# Patient Record
Sex: Male | Born: 1945
Health system: Southern US, Community
[De-identification: ages and names within clinical notes are randomized; demographics above are authoritative.]

## PROBLEM LIST (undated history)

## (undated) DIAGNOSIS — E041 Nontoxic single thyroid nodule: Secondary | ICD-10-CM

## (undated) DIAGNOSIS — E785 Hyperlipidemia, unspecified: Secondary | ICD-10-CM

## (undated) DIAGNOSIS — I10 Essential (primary) hypertension: Secondary | ICD-10-CM

## (undated) DIAGNOSIS — K219 Gastro-esophageal reflux disease without esophagitis: Secondary | ICD-10-CM

## (undated) HISTORY — DX: Hyperlipidemia, unspecified: E78.5

## (undated) HISTORY — DX: Essential (primary) hypertension: I10

## (undated) HISTORY — DX: Nontoxic single thyroid nodule: E04.1

## (undated) HISTORY — PX: CATARACT EXTRACTION, BILATERAL: SHX1313

## (undated) HISTORY — DX: Gastro-esophageal reflux disease without esophagitis: K21.9

## (undated) HISTORY — PX: THYROID SURGERY: SHX805

---

## 2002-10-14 LAB — HM COLONOSCOPY: HM Colonoscopy: NORMAL

## 2002-12-28 ENCOUNTER — Ambulatory Visit (HOSPITAL_COMMUNITY): Admission: RE | Admit: 2002-12-28 | Discharge: 2002-12-28 | Payer: Self-pay | Admitting: Gastroenterology

## 2002-12-28 ENCOUNTER — Encounter: Payer: Self-pay | Admitting: Internal Medicine

## 2004-07-23 ENCOUNTER — Encounter: Admission: RE | Admit: 2004-07-23 | Discharge: 2004-07-23 | Payer: Self-pay | Admitting: Internal Medicine

## 2004-08-17 ENCOUNTER — Ambulatory Visit: Payer: Self-pay | Admitting: Internal Medicine

## 2005-03-15 ENCOUNTER — Encounter: Payer: Self-pay | Admitting: Internal Medicine

## 2005-05-24 ENCOUNTER — Ambulatory Visit: Payer: Self-pay | Admitting: Internal Medicine

## 2005-06-18 ENCOUNTER — Ambulatory Visit: Payer: Self-pay | Admitting: Internal Medicine

## 2005-06-24 ENCOUNTER — Ambulatory Visit: Payer: Self-pay | Admitting: Internal Medicine

## 2005-07-01 ENCOUNTER — Ambulatory Visit: Payer: Self-pay | Admitting: Internal Medicine

## 2005-12-12 ENCOUNTER — Ambulatory Visit: Payer: Self-pay | Admitting: Internal Medicine

## 2005-12-23 ENCOUNTER — Ambulatory Visit: Payer: Self-pay | Admitting: Internal Medicine

## 2006-01-03 ENCOUNTER — Ambulatory Visit: Payer: Self-pay | Admitting: Internal Medicine

## 2006-01-10 ENCOUNTER — Ambulatory Visit: Payer: Self-pay | Admitting: Internal Medicine

## 2006-05-12 ENCOUNTER — Ambulatory Visit: Payer: Self-pay | Admitting: Internal Medicine

## 2006-06-09 ENCOUNTER — Ambulatory Visit: Payer: Self-pay | Admitting: Internal Medicine

## 2006-06-19 ENCOUNTER — Encounter: Payer: Self-pay | Admitting: Cardiology

## 2006-06-19 ENCOUNTER — Ambulatory Visit: Payer: Self-pay

## 2006-07-03 ENCOUNTER — Ambulatory Visit: Payer: Self-pay

## 2006-07-11 ENCOUNTER — Encounter: Admission: RE | Admit: 2006-07-11 | Discharge: 2006-07-11 | Payer: Self-pay | Admitting: Internal Medicine

## 2006-07-18 ENCOUNTER — Ambulatory Visit: Payer: Self-pay | Admitting: Internal Medicine

## 2006-07-24 ENCOUNTER — Other Ambulatory Visit: Admission: RE | Admit: 2006-07-24 | Discharge: 2006-07-24 | Payer: Self-pay | Admitting: Interventional Radiology

## 2006-07-24 ENCOUNTER — Encounter (INDEPENDENT_AMBULATORY_CARE_PROVIDER_SITE_OTHER): Payer: Self-pay | Admitting: Specialist

## 2006-07-24 ENCOUNTER — Encounter: Admission: RE | Admit: 2006-07-24 | Discharge: 2006-07-24 | Payer: Self-pay | Admitting: Internal Medicine

## 2006-11-03 ENCOUNTER — Ambulatory Visit: Payer: Self-pay | Admitting: Internal Medicine

## 2006-12-26 ENCOUNTER — Ambulatory Visit: Payer: Self-pay | Admitting: Internal Medicine

## 2006-12-26 LAB — CONVERTED CEMR LAB
ALT: 21 units/L (ref 0–40)
AST: 26 units/L (ref 0–37)
Albumin: 4 g/dL (ref 3.5–5.2)
Alkaline Phosphatase: 43 units/L (ref 39–117)
BUN: 9 mg/dL (ref 6–23)
Basophils Absolute: 0 10*3/uL (ref 0.0–0.1)
Basophils Relative: 0 % (ref 0.0–1.0)
Bilirubin, Direct: 0.1 mg/dL (ref 0.0–0.3)
CO2: 31 meq/L (ref 19–32)
Calcium: 9.6 mg/dL (ref 8.4–10.5)
Chloride: 104 meq/L (ref 96–112)
Cholesterol: 123 mg/dL (ref 0–200)
Creatinine, Ser: 1.1 mg/dL (ref 0.4–1.5)
Eosinophils Absolute: 0.6 10*3/uL (ref 0.0–0.6)
Eosinophils Relative: 12.8 % — ABNORMAL HIGH (ref 0.0–5.0)
GFR calc Af Amer: 88 mL/min
GFR calc non Af Amer: 73 mL/min
Glucose, Bld: 96 mg/dL (ref 70–99)
HCT: 39.1 % (ref 39.0–52.0)
HDL: 43.3 mg/dL (ref >39.0)
Hemoglobin: 13.8 g/dL (ref 13.0–17.0)
LDL Cholesterol: 69 mg/dL (ref 0–99)
Lymphocytes Relative: 22.6 % (ref 12.0–46.0)
MCHC: 35.4 g/dL (ref 30.0–36.0)
MCV: 93.1 fL (ref 78.0–100.0)
Monocytes Absolute: 0.3 10*3/uL (ref 0.2–0.7)
Monocytes Relative: 6.3 % (ref 3.0–11.0)
Neutro Abs: 2.8 10*3/uL (ref 1.4–7.7)
Neutrophils Relative %: 58.3 % (ref 43.0–77.0)
PSA: 0.68 ng/mL (ref 0.10–4.00)
Platelets: 184 10*3/uL (ref 150–400)
Potassium: 4.9 meq/L (ref 3.5–5.1)
RBC: 4.2 M/uL — ABNORMAL LOW (ref 4.22–5.81)
RDW: 13.1 % (ref 11.5–14.6)
Sodium: 141 meq/L (ref 135–145)
TSH: 0.4 microintl units/mL (ref 0.35–5.50)
Total Bilirubin: 0.7 mg/dL (ref 0.3–1.2)
Total CHOL/HDL Ratio: 2.8
Total Protein: 7.2 g/dL (ref 6.0–8.3)
Triglycerides: 56 mg/dL (ref 0–149)
VLDL: 11 mg/dL (ref 0–40)
WBC: 4.8 10*3/uL (ref 4.5–10.5)

## 2007-01-12 ENCOUNTER — Ambulatory Visit: Payer: Self-pay | Admitting: Internal Medicine

## 2007-04-19 IMAGING — US US SOFT TISSUE HEAD/NECK
1 series · 14 of 25 positions shown · non-contrast
Comparison: none

CLINICAL DATA: Thyroid nodules noted on carotid artery study. 
 THYROID ULTRASOUND:
TECHNIQUE: Ultrasound examination of the thyroid gland and adjacent soft tissue structures was performed.
 The thyroid gland is diffusely enlarged.   The right lobe measures 7.6 cm sagittally with a depth of 2.8 cm and width of 3.1 cm.  The left lobe measures 6.5 x 2.9 x 2.5 cm with the isthmus being thickened measuring 8 mm.  There are nodules present bilaterally.  The largest nodule is in the lower pole on the right measuring 1.9 x 1.1 x 1.4 cm with a second nodule in the periphery of the right mid lobe measuring 1.5 x 1.2 x 1.1 cm.  Two small subcentimeter nodules are noted on the left.

[Series 1: unknown · 0.13mm/px · 14 of 58 slices shown]
[im 1/58]
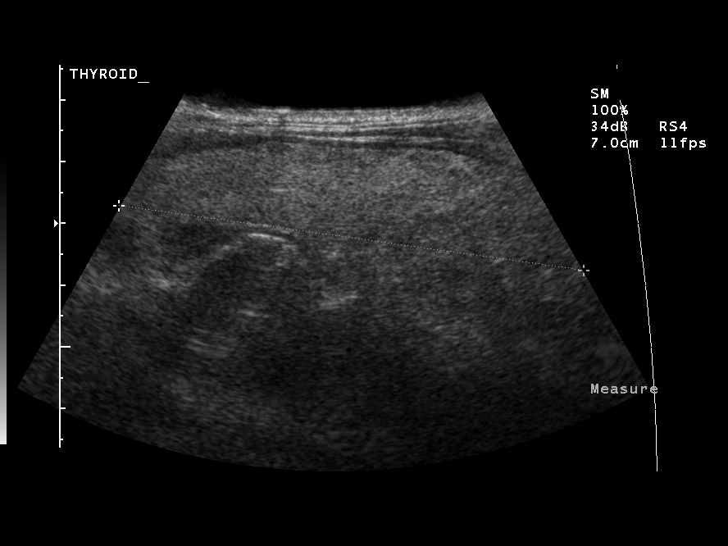
[im 5/58]
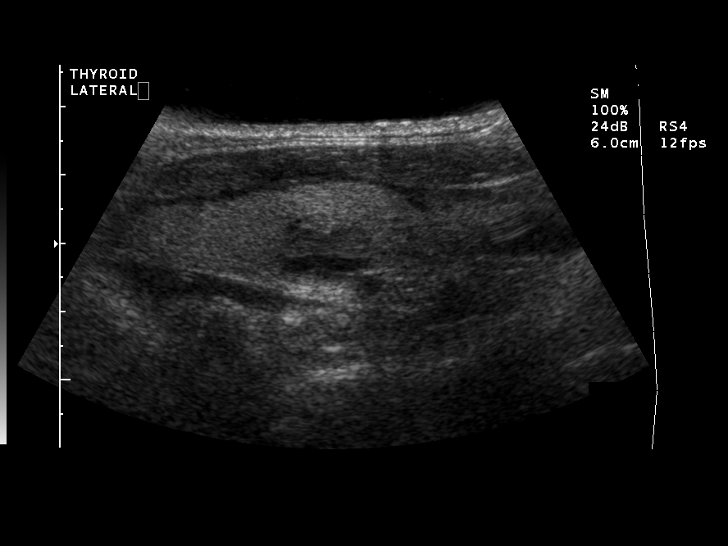
[im 10/58]
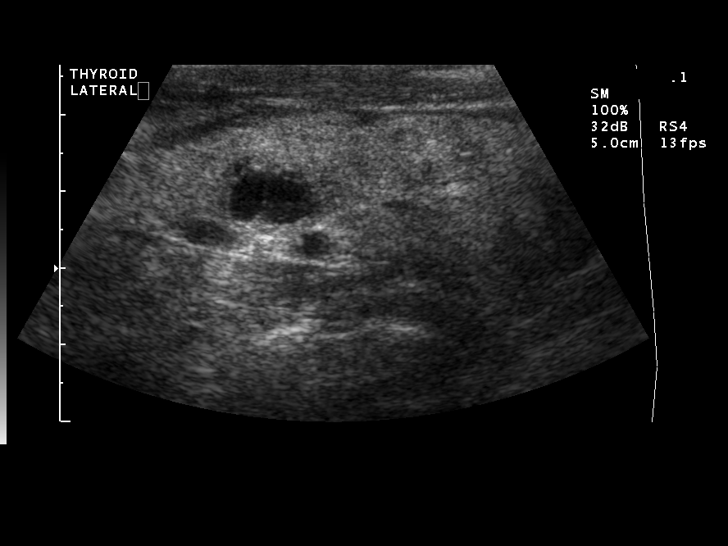
[im 15/58]
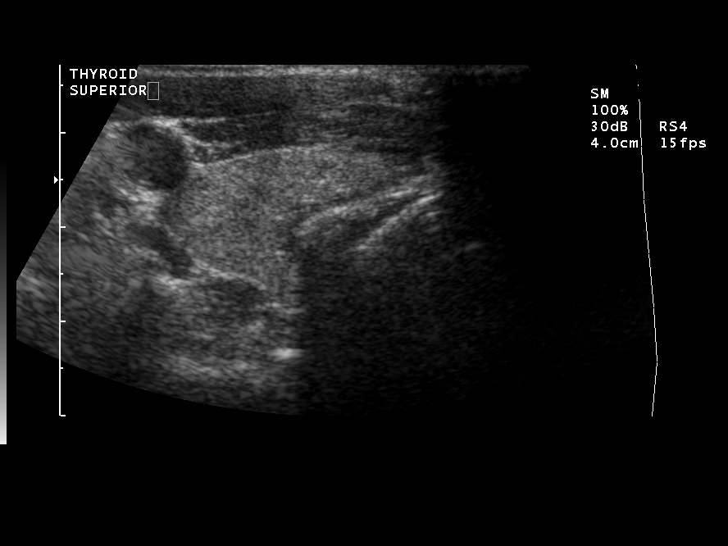
[im 20/58]
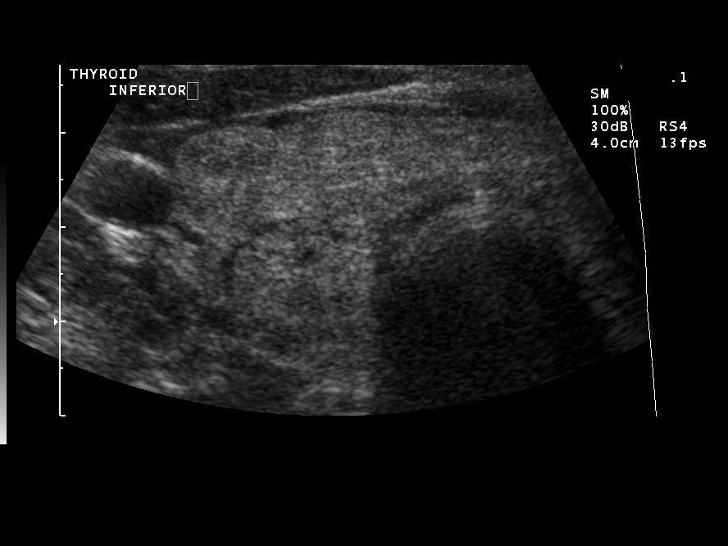
[im 22/58]
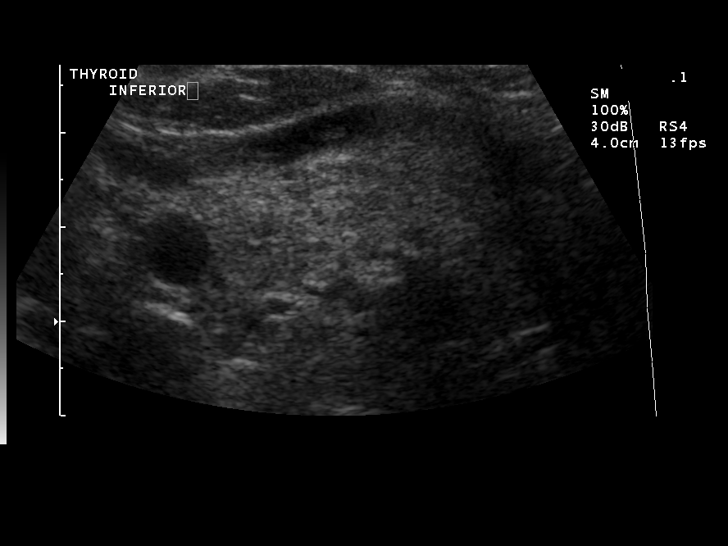
[im 27/58]
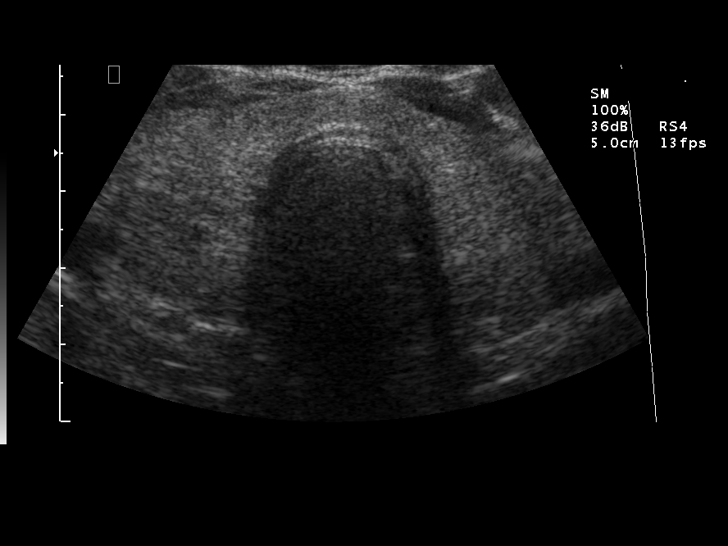
[im 31/58]
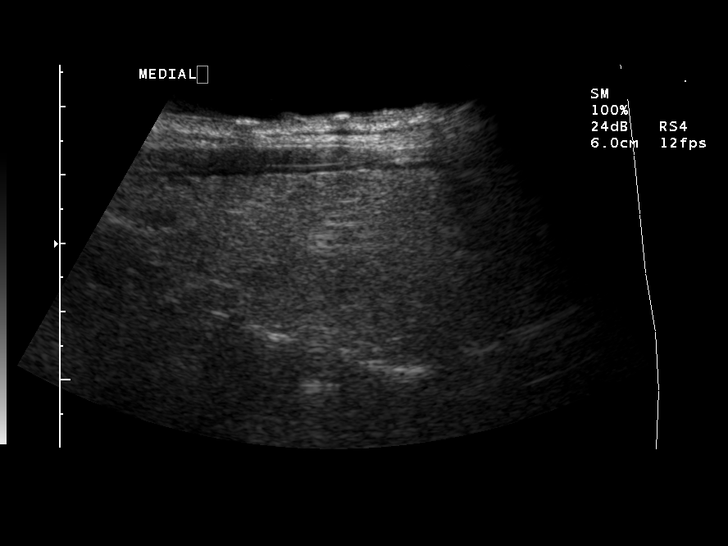
[im 36/58]
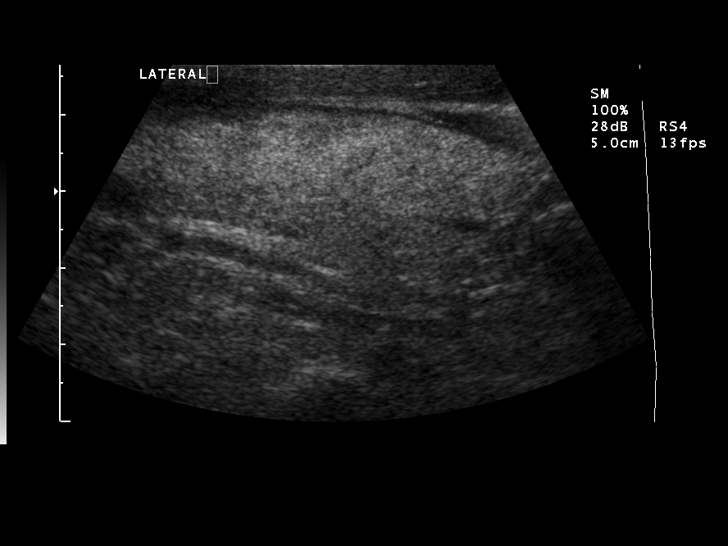
[im 39/58]
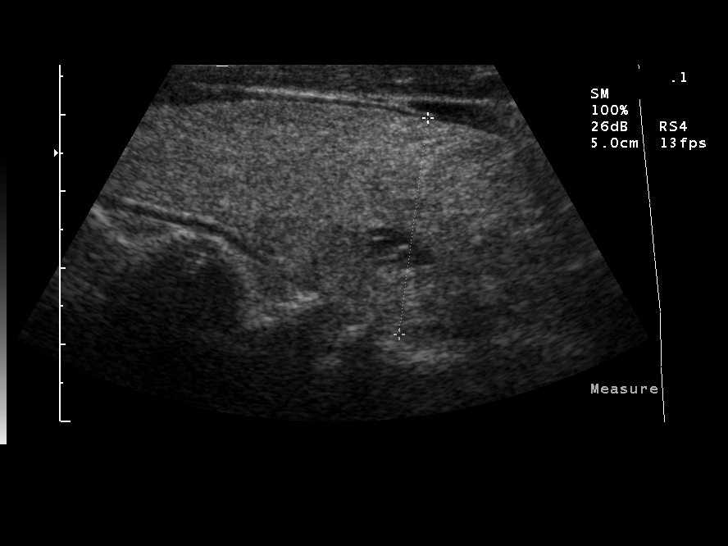
[im 43/58]
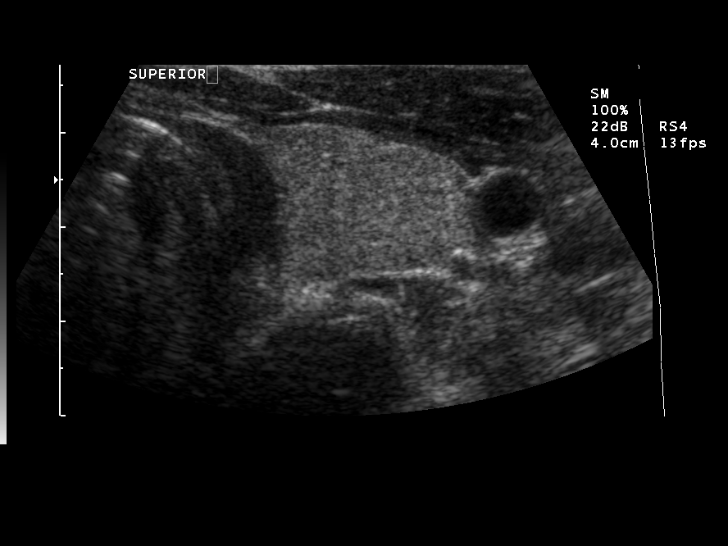
[im 48/58]
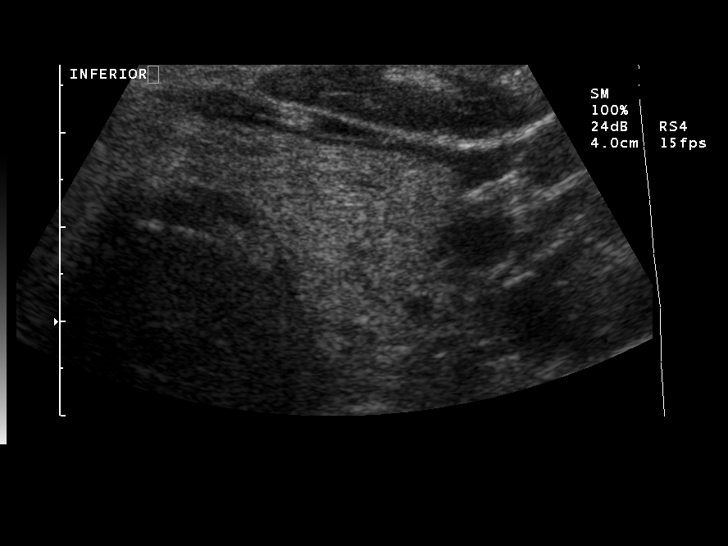
[im 53/58]
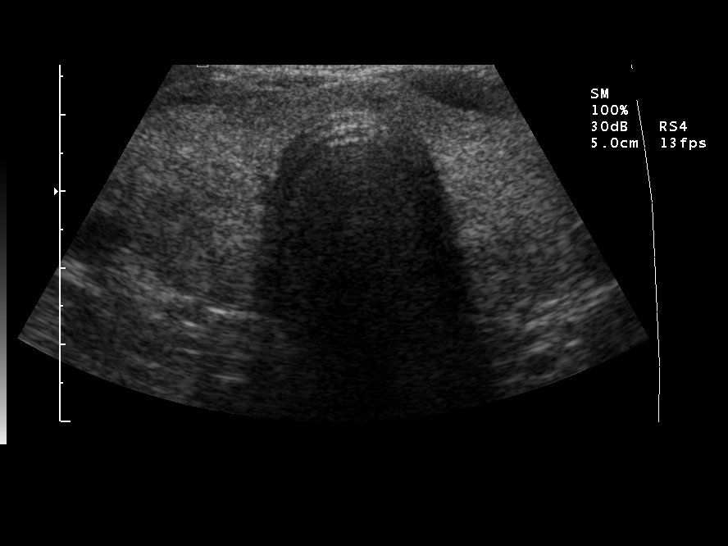
[im 58/58]
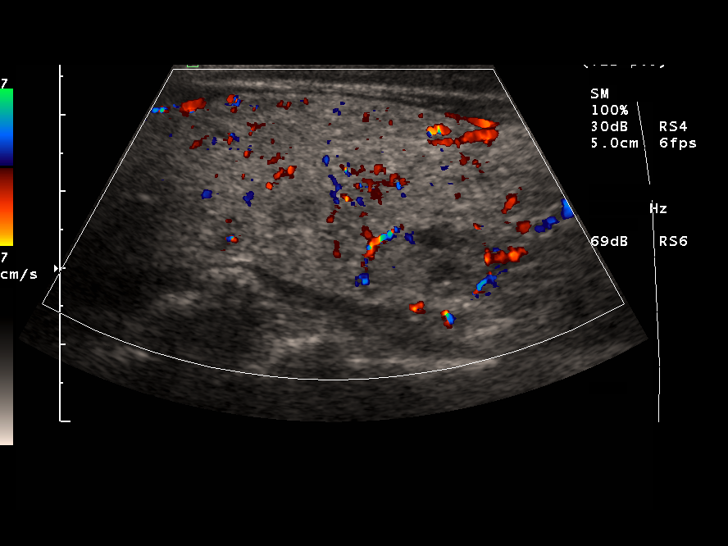

[14 of 25 positions shown; findings below may reference images not displayed]

IMPRESSION: Bilateral thyroid nodules, the dominant of which is in the lower pole on the right measuring 1.9 cm.  Consider biopsy of this nodule.

## 2007-05-05 DIAGNOSIS — E041 Nontoxic single thyroid nodule: Secondary | ICD-10-CM | POA: Insufficient documentation

## 2007-05-05 DIAGNOSIS — K219 Gastro-esophageal reflux disease without esophagitis: Secondary | ICD-10-CM | POA: Insufficient documentation

## 2007-05-05 DIAGNOSIS — E785 Hyperlipidemia, unspecified: Secondary | ICD-10-CM | POA: Insufficient documentation

## 2007-05-05 DIAGNOSIS — I10 Essential (primary) hypertension: Secondary | ICD-10-CM | POA: Insufficient documentation

## 2007-05-05 HISTORY — DX: Nontoxic single thyroid nodule: E04.1

## 2007-06-26 ENCOUNTER — Ambulatory Visit: Payer: Self-pay | Admitting: Internal Medicine

## 2007-06-26 LAB — CONVERTED CEMR LAB
ALT: 22 units/L (ref 0–53)
AST: 35 units/L (ref 0–37)
Albumin: 4.3 g/dL (ref 3.5–5.2)
Alkaline Phosphatase: 52 units/L (ref 39–117)
BUN: 8 mg/dL (ref 6–23)
Basophils Absolute: 0 10*3/uL (ref 0.0–0.1)
Basophils Relative: 0.1 % (ref 0.0–1.0)
Bilirubin Urine: NEGATIVE
Bilirubin, Direct: 0.1 mg/dL (ref 0.0–0.3)
Blood in Urine, dipstick: NEGATIVE
CO2: 29 meq/L (ref 19–32)
Calcium: 9.6 mg/dL (ref 8.4–10.5)
Chloride: 109 meq/L (ref 96–112)
Cholesterol: 149 mg/dL (ref 0–200)
Creatinine, Ser: 1 mg/dL (ref 0.4–1.5)
Eosinophils Absolute: 0.8 10*3/uL — ABNORMAL HIGH (ref 0.0–0.6)
Eosinophils Relative: 11.2 % — ABNORMAL HIGH (ref 0.0–5.0)
GFR calc Af Amer: 98 mL/min
GFR calc non Af Amer: 81 mL/min
Glucose, Bld: 108 mg/dL — ABNORMAL HIGH (ref 70–99)
Glucose, Urine, Semiquant: NEGATIVE
HCT: 40.6 % (ref 39.0–52.0)
HDL: 35.8 mg/dL — ABNORMAL LOW (ref >39.0)
Hemoglobin: 13.9 g/dL (ref 13.0–17.0)
Ketones, urine, test strip: NEGATIVE
LDL Cholesterol: 100 mg/dL — ABNORMAL HIGH (ref 0–99)
Lymphocytes Relative: 15.9 % (ref 12.0–46.0)
MCHC: 34.3 g/dL (ref 30.0–36.0)
MCV: 92.2 fL (ref 78.0–100.0)
Monocytes Absolute: 0.4 10*3/uL (ref 0.2–0.7)
Monocytes Relative: 6.1 % (ref 3.0–11.0)
Neutro Abs: 4.8 10*3/uL (ref 1.4–7.7)
Neutrophils Relative %: 66.7 % (ref 43.0–77.0)
Nitrite: NEGATIVE
PSA: 1 ng/mL (ref 0.10–4.00)
Platelets: 199 10*3/uL (ref 150–400)
Potassium: 5.1 meq/L (ref 3.5–5.1)
Protein, U semiquant: NEGATIVE
RBC: 4.4 M/uL (ref 4.22–5.81)
RDW: 13.3 % (ref 11.5–14.6)
Sodium: 142 meq/L (ref 135–145)
Specific Gravity, Urine: 1.015
TSH: 0.66 microintl units/mL (ref 0.35–5.50)
Total Bilirubin: 0.9 mg/dL (ref 0.3–1.2)
Total CHOL/HDL Ratio: 4.2
Total Protein: 6.8 g/dL (ref 6.0–8.3)
Triglycerides: 65 mg/dL (ref 0–149)
Urobilinogen, UA: 0.2
VLDL: 13 mg/dL (ref 0–40)
WBC Urine, dipstick: NEGATIVE
WBC: 7.1 10*3/uL (ref 4.5–10.5)
pH: 7

## 2007-07-09 ENCOUNTER — Ambulatory Visit: Payer: Self-pay | Admitting: Internal Medicine

## 2007-10-20 ENCOUNTER — Ambulatory Visit: Payer: Self-pay | Admitting: Internal Medicine

## 2007-11-04 ENCOUNTER — Telehealth: Payer: Self-pay | Admitting: Internal Medicine

## 2007-12-16 ENCOUNTER — Ambulatory Visit: Payer: Self-pay | Admitting: Internal Medicine

## 2007-12-16 LAB — CONVERTED CEMR LAB
ALT: 27 units/L (ref 0–53)
AST: 29 units/L (ref 0–37)
Albumin: 4.2 g/dL (ref 3.5–5.2)
Alkaline Phosphatase: 38 units/L — ABNORMAL LOW (ref 39–117)
BUN: 10 mg/dL (ref 6–23)
Basophils Absolute: 0 10*3/uL (ref 0.0–0.1)
Basophils Relative: 0.6 % (ref 0.0–1.0)
Bilirubin Urine: NEGATIVE
Bilirubin, Direct: 0.1 mg/dL (ref 0.0–0.3)
Blood in Urine, dipstick: NEGATIVE
CO2: 27 meq/L (ref 19–32)
Calcium: 9.8 mg/dL (ref 8.4–10.5)
Chloride: 102 meq/L (ref 96–112)
Cholesterol: 120 mg/dL (ref 0–200)
Creatinine, Ser: 1 mg/dL (ref 0.4–1.5)
Eosinophils Absolute: 0.6 10*3/uL (ref 0.0–0.6)
Eosinophils Relative: 14.2 % — ABNORMAL HIGH (ref 0.0–5.0)
GFR calc Af Amer: 98 mL/min
GFR calc non Af Amer: 81 mL/min
Glucose, Bld: 95 mg/dL (ref 70–99)
Glucose, Urine, Semiquant: NEGATIVE
HCT: 44.1 % (ref 39.0–52.0)
HDL: 31 mg/dL — ABNORMAL LOW (ref >39.0)
Hemoglobin: 14.6 g/dL (ref 13.0–17.0)
Ketones, urine, test strip: NEGATIVE
LDL Cholesterol: 75 mg/dL (ref 0–99)
Lymphocytes Relative: 27.2 % (ref 12.0–46.0)
MCHC: 33.1 g/dL (ref 30.0–36.0)
MCV: 93.9 fL (ref 78.0–100.0)
Monocytes Absolute: 0.4 10*3/uL (ref 0.2–0.7)
Monocytes Relative: 8 % (ref 3.0–11.0)
Neutro Abs: 2.2 10*3/uL (ref 1.4–7.7)
Neutrophils Relative %: 50 % (ref 43.0–77.0)
Nitrite: NEGATIVE
PSA: 0.74 ng/mL (ref 0.10–4.00)
Platelets: 169 10*3/uL (ref 150–400)
Potassium: 4.8 meq/L (ref 3.5–5.1)
Protein, U semiquant: NEGATIVE
RBC: 4.7 M/uL (ref 4.22–5.81)
RDW: 12.6 % (ref 11.5–14.6)
Sodium: 136 meq/L (ref 135–145)
Specific Gravity, Urine: 1.02
TSH: 0.72 microintl units/mL (ref 0.35–5.50)
Total Bilirubin: 0.6 mg/dL (ref 0.3–1.2)
Total CHOL/HDL Ratio: 3.9
Total Protein: 6.8 g/dL (ref 6.0–8.3)
Triglycerides: 69 mg/dL (ref 0–149)
Urobilinogen, UA: 0.2
VLDL: 14 mg/dL (ref 0–40)
WBC Urine, dipstick: NEGATIVE
WBC: 4.4 10*3/uL — ABNORMAL LOW (ref 4.5–10.5)
pH: 6

## 2007-12-22 ENCOUNTER — Telehealth: Payer: Self-pay | Admitting: Internal Medicine

## 2008-01-13 ENCOUNTER — Telehealth: Payer: Self-pay | Admitting: Internal Medicine

## 2008-01-13 ENCOUNTER — Telehealth (INDEPENDENT_AMBULATORY_CARE_PROVIDER_SITE_OTHER): Payer: Self-pay | Admitting: *Deleted

## 2008-05-25 ENCOUNTER — Telehealth: Payer: Self-pay | Admitting: Internal Medicine

## 2008-06-10 ENCOUNTER — Encounter: Payer: Self-pay | Admitting: Internal Medicine

## 2008-06-24 ENCOUNTER — Ambulatory Visit: Payer: Self-pay | Admitting: Internal Medicine

## 2009-09-15 ENCOUNTER — Ambulatory Visit: Payer: Self-pay | Admitting: Family Medicine

## 2009-09-27 ENCOUNTER — Telehealth: Payer: Self-pay | Admitting: Internal Medicine

## 2010-01-01 ENCOUNTER — Ambulatory Visit: Payer: Self-pay | Admitting: Internal Medicine

## 2010-01-01 LAB — CONVERTED CEMR LAB
ALT: 22 units/L (ref 0–53)
AST: 28 units/L (ref 0–37)
Albumin: 4.1 g/dL (ref 3.5–5.2)
Alkaline Phosphatase: 43 units/L (ref 39–117)
BUN: 12 mg/dL (ref 6–23)
Basophils Absolute: 0 10*3/uL (ref 0.0–0.1)
Basophils Relative: 0.9 % (ref 0.0–3.0)
Bilirubin Urine: NEGATIVE
Bilirubin, Direct: 0 mg/dL (ref 0.0–0.3)
CO2: 28 meq/L (ref 19–32)
Calcium: 9.4 mg/dL (ref 8.4–10.5)
Chloride: 107 meq/L (ref 96–112)
Cholesterol: 101 mg/dL (ref 0–200)
Creatinine, Ser: 1 mg/dL (ref 0.4–1.5)
Eosinophils Absolute: 0.7 10*3/uL (ref 0.0–0.7)
Eosinophils Relative: 14.7 % — ABNORMAL HIGH (ref 0.0–5.0)
GFR calc non Af Amer: 80.32 mL/min (ref >60)
Glucose, Bld: 97 mg/dL (ref 70–99)
Glucose, Urine, Semiquant: NEGATIVE
HCT: 40.5 % (ref 39.0–52.0)
HDL: 39.5 mg/dL (ref >39.00)
Hemoglobin: 13.6 g/dL (ref 13.0–17.0)
Ketones, urine, test strip: NEGATIVE
LDL Cholesterol: 50 mg/dL (ref 0–99)
Lymphocytes Relative: 23.6 % (ref 12.0–46.0)
Lymphs Abs: 1.2 10*3/uL (ref 0.7–4.0)
MCHC: 33.5 g/dL (ref 30.0–36.0)
MCV: 97.6 fL (ref 78.0–100.0)
Monocytes Absolute: 0.3 10*3/uL (ref 0.1–1.0)
Monocytes Relative: 7 % (ref 3.0–12.0)
Neutro Abs: 2.8 10*3/uL (ref 1.4–7.7)
Neutrophils Relative %: 53.8 % (ref 43.0–77.0)
Nitrite: NEGATIVE
PSA: 0.82 ng/mL (ref 0.10–4.00)
Platelets: 162 10*3/uL (ref 150.0–400.0)
Potassium: 5.2 meq/L — ABNORMAL HIGH (ref 3.5–5.1)
Protein, U semiquant: NEGATIVE
RBC: 4.15 M/uL — ABNORMAL LOW (ref 4.22–5.81)
RDW: 12 % (ref 11.5–14.6)
Sodium: 139 meq/L (ref 135–145)
Specific Gravity, Urine: 1.015
TSH: 0.4 microintl units/mL (ref 0.35–5.50)
Total Bilirubin: 0.5 mg/dL (ref 0.3–1.2)
Total CHOL/HDL Ratio: 3
Total Protein: 6.8 g/dL (ref 6.0–8.3)
Triglycerides: 56 mg/dL (ref 0.0–149.0)
Urobilinogen, UA: 0.2
VLDL: 11.2 mg/dL (ref 0.0–40.0)
WBC Urine, dipstick: NEGATIVE
WBC: 5 10*3/uL (ref 4.5–10.5)
pH: 5.5

## 2010-01-08 ENCOUNTER — Ambulatory Visit: Payer: Self-pay | Admitting: Internal Medicine

## 2010-01-12 ENCOUNTER — Encounter: Admission: RE | Admit: 2010-01-12 | Discharge: 2010-01-12 | Payer: Self-pay | Admitting: Internal Medicine

## 2010-01-30 ENCOUNTER — Ambulatory Visit: Payer: Self-pay | Admitting: Internal Medicine

## 2010-02-01 LAB — CONVERTED CEMR LAB
OCCULT 1: NEGATIVE
OCCULT 2: NEGATIVE
OCCULT 3: NEGATIVE

## 2010-05-29 ENCOUNTER — Telehealth: Payer: Self-pay | Admitting: Internal Medicine

## 2010-06-20 ENCOUNTER — Encounter: Payer: Self-pay | Admitting: Internal Medicine

## 2010-11-04 ENCOUNTER — Encounter: Payer: Self-pay | Admitting: Internal Medicine

## 2010-11-13 NOTE — Progress Notes (Signed)
Summary: Tdap  Phone Note Call from Patient   Caller: Patient Call For: Birdie Sons MD Summary of Call: Confirmed pt got Tdap on 12/2009. Initial call taken by: Lynann Beaver CMA,  May 29, 2010 3:02 PM

## 2010-11-13 NOTE — Procedures (Signed)
Summary: Colonoscopy Report/[facility]  Colonoscopy Report/[facility]   Imported By: Maryln Gottron 01/16/2010 10:45:38  _____________________________________________________________________  External Attachment:    Type:   Image     Comment:   External Document

## 2010-11-13 NOTE — Miscellaneous (Signed)
Summary: Waiver of Liability for Zostavax  Waiver of Liability for Zostavax   Imported By: Maryln Gottron 01/09/2010 12:56:05  _____________________________________________________________________  External Attachment:    Type:   Image     Comment:   External Document

## 2010-11-13 NOTE — Assessment & Plan Note (Signed)
Summary: CPX//CCM   Vital Signs:  Patient profile:   64 year old male Height:      69.5 inches Weight:      186 pounds BMI:     27.17 Pulse rate:   72 / minute Resp:     12 per minute BP sitting:   148 / 92  (left arm) Cuff size:   regular  Vitals Entered By: Gladis Riffle, RN (January 08, 2010 8:14 AM)  Nutrition Counseling: Patient's BMI is greater than 25 and therefore counseled on weight management options. CC: cpx, labs done Is Patient Diabetic? No Comments BP 113/88-159/86 ayt home; average 135/85 for the year   CC:  cpx and labs done.  History of Present Illness: cpx  home bps average 135/85  Preventive Screening-Counseling & Management  Alcohol-Tobacco     Smoking Status: quit     Year Quit: 1970  Current Problems (verified): 1)  Physical Examination, Normal  (ICD-V70.0) 2)  Thyroid Nodule  (ICD-241.0) 3)  Hypertension  (ICD-401.9) 4)  Hyperlipidemia  (ICD-272.4) 5)  Gerd  (ICD-530.81)  Current Medications (verified): 1)  Simvastatin 40 Mg Tabs (Simvastatin) .... Take One Tablet At Bedtime 2)  Lisinopril 20 Mg Tabs (Lisinopril) .... Take 1 Tablet By Mouth At Bedtime 3)  Multivitamins   Tabs (Multiple Vitamin) .... Once Daily 4)  Fish Oil 500 Mg  Caps (Omega-3 Fatty Acids) .... Once Daily 5)  Aspirin 81 Mg  Tbec (Aspirin) .... Once Daily  Allergies (verified): No Known Drug Allergies  Past History:  Past Medical History: Last updated: 05/05/2007 GERD  EGD  2000 Hyperlipidemia Hypertension  Past Surgical History: Last updated: 05/05/2007 thyroid nodule bx negative  Family History: Last updated: 07/09/2007 coronary artery disease father MI age 75 Brother MI 16 brother htn sister htn  Social History: Last updated: 07/09/2007 Occupation: Married Former Smoker Alcohol use-yes Regular exercise-yes  Risk Factors: Exercise: yes (07/09/2007)  Risk Factors: Smoking Status: quit (01/08/2010)  Review of Systems       All other systems  reviewed and were negative   Physical Exam  General:  Well-developed,well-nourished,in no acute distress; alert,appropriate and cooperative throughout examination Head:  normocephalic and atraumatic.   Eyes:  pupils equal and pupils round.   Ears:  R ear normal and L ear normal.   Neck:  No deformities, masses, or tenderness noted. Chest Wall:  no deformities and no tenderness.   Lungs:  normal respiratory effort and no intercostal retractions.   Heart:  normal rate and regular rhythm.   Abdomen:  soft and non-tender.   Rectal:  no external abnormalities and no hemorrhoids.   Prostate:  no nodules and no asymmetry.   Msk:  normal ROM and no joint tenderness.   Pulses:  R radial normal and L radial normal.   Extremities:  No clubbing, cyanosis, edema, or deformity noted  Neurologic:  alert & oriented X3 and gait normal.   Skin:  turgor normal and color normal.   Cervical Nodes:  no anterior cervical adenopathy and no posterior cervical adenopathy.   Psych:  Oriented X3 and good eye contact.     Impression & Recommendations:  Problem # 1:  PHYSICAL EXAMINATION, NORMAL (ICD-V70.0) health maint UTD encouraged daily exercise  Problem # 2:  HYPERTENSION (ICD-401.9) home bps are adequate His updated medication list for this problem includes:    Lisinopril 20 Mg Tabs (Lisinopril) .Marland Kitchen... Take 1 tablet by mouth at bedtime  BP today: 148/92 Prior BP: 122/90 (09/15/2009)  Prior  10 Yr Risk Heart Disease: 22 % (07/09/2007)  Labs Reviewed: K+: 5.2 (01/01/2010) Creat: : 1.0 (01/01/2010)   Chol: 101 (01/01/2010)   HDL: 39.50 (01/01/2010)   LDL: 50 (01/01/2010)   TG: 56.0 (01/01/2010)  Problem # 3:  HYPERLIPIDEMIA (ICD-272.4) controlled consider cutting simvastatin in 1/2 His updated medication list for this problem includes:    Simvastatin 20 Mg Tabs (Simvastatin) .Marland Kitchen... 1 by mouth at bedtime  Labs Reviewed: SGOT: 28 (01/01/2010)   SGPT: 22 (01/01/2010)  Prior 10 Yr Risk Heart  Disease: 22 % (07/09/2007)   HDL:39.50 (01/01/2010), 31.0 (12/16/2007)  LDL:50 (01/01/2010), 75 (12/16/2007)  Chol:101 (01/01/2010), 120 (12/16/2007)  Trig:56.0 (01/01/2010), 69 (12/16/2007)  Problem # 4:  GERD (ICD-530.81) controlled and on no meds  Problem # 5:  THYROID NODULE (ICD-241.0)  reviewed previous ultrasound and biopsy report  Orders: Radiology Referral (Radiology)  Complete Medication List: 1)  Simvastatin 20 Mg Tabs (Simvastatin) .Marland Kitchen.. 1 by mouth at bedtime 2)  Lisinopril 20 Mg Tabs (Lisinopril) .... Take 1 tablet by mouth at bedtime 3)  Multivitamins Tabs (Multiple vitamin) .... Once daily 4)  Fish Oil 500 Mg Caps (Omega-3 fatty acids) .... Once daily 5)  Aspirin 81 Mg Tbec (Aspirin) .... Once daily  Patient Instructions: 1)  Please schedule a follow-up appointment in 6 months. 2)  lipids 272.4 3)  liver 995.2 4)  bmet--995.2 Prescriptions: SIMVASTATIN 20 MG TABS (SIMVASTATIN) 1 by mouth at bedtime  #90 x 3   Entered and Authorized by:   Birdie Sons MD   Signed by:   Birdie Sons MD on 01/08/2010   Method used:   Electronically to        [facility] [id number] (retail)       [address] [place], [place]  [address]       Ph: [phone]       Fax: [phone]   RxID:   [id number]       Appended Document: CPX//CCM   Immunizations Administered:  Tetanus Vaccine:    Vaccine Type: Tdap    Site: right deltoid    Mfr: GlaxoSmithKline    Dose: 0.5 ml    Route: IM    Given by: Gladis Riffle, RN    Exp. Date: 08/09/2010    Lot #: [id number]  Zostavax # 1:    Vaccine Type: Zostavax    Site: left deltoid    Mfr: Merck    Dose: 0.5 ml    Route: Farrell    Given by: Gladis Riffle, RN    Exp. Date: 11/01/2010    Lot #: [id number]    VIS given: 07/26/05 given January 08, 2010.

## 2010-11-13 NOTE — Procedures (Signed)
Summary: EGD/Dr. Charna Elizabeth  EGD/Dr. Charna Elizabeth   Imported By: Maryln Gottron 01/16/2010 13:52:13  _____________________________________________________________________  External Attachment:    Type:   Image     Comment:   External Document

## 2011-01-14 ENCOUNTER — Other Ambulatory Visit: Payer: Self-pay | Admitting: Internal Medicine

## 2011-06-21 ENCOUNTER — Other Ambulatory Visit: Payer: Self-pay

## 2011-06-27 ENCOUNTER — Other Ambulatory Visit: Payer: Self-pay | Admitting: Internal Medicine

## 2011-06-28 ENCOUNTER — Encounter: Payer: Self-pay | Admitting: Internal Medicine

## 2011-07-09 ENCOUNTER — Encounter: Payer: Self-pay | Admitting: Internal Medicine

## 2011-07-31 ENCOUNTER — Encounter: Payer: Self-pay | Admitting: Internal Medicine

## 2011-09-06 ENCOUNTER — Encounter: Payer: Self-pay | Admitting: Internal Medicine

## 2011-10-04 ENCOUNTER — Ambulatory Visit (INDEPENDENT_AMBULATORY_CARE_PROVIDER_SITE_OTHER): Payer: Medicare HMO | Admitting: Internal Medicine

## 2011-10-04 ENCOUNTER — Encounter: Payer: Self-pay | Admitting: Internal Medicine

## 2011-10-04 VITALS — BP 150/94 | HR 76 | Temp 98.3°F | Ht 70.0 in | Wt 175.0 lb

## 2011-10-04 DIAGNOSIS — Z0000 Encounter for general adult medical examination without abnormal findings: Secondary | ICD-10-CM

## 2011-10-04 NOTE — Progress Notes (Signed)
Patient ID: Clifford Houston, male   DOB: 09-Jun-1946, 65 y.o.   MRN: [id number] CPX  Past Medical History  Diagnosis Date  . GERD (gastroesophageal reflux disease)   . Hyperlipidemia   . Hypertension     History   Social History  . Marital Status: Married    Spouse Name: N/A    Number of Children: N/A  . Years of Education: N/A   Occupational History  . Not on file.   Social History Main Topics  . Smoking status: Former Games developer  . Smokeless tobacco: Not on file  . Alcohol Use: Yes  . Drug Use: No  . Sexually Active: Not on file   Other Topics Concern  . Not on file   Social History Narrative  . No narrative on file    Past Surgical History  Procedure Date  . Thyroid surgery     Family History  Problem Relation Age of Onset  . Heart disease Father   . Heart attack Father   . Hypertension Sister   . Heart attack Brother   . Hypertension Brother     No Known Allergies  Current Outpatient Prescriptions on File Prior to Visit  Medication Sig Dispense Refill  . lisinopril (PRINIVIL,ZESTRIL) 20 MG tablet TAKE 1 TABLET BY MOUTH ATBEDTIME  100 tablet  3  . simvastatin (ZOCOR) 20 MG tablet TAKE 1 TABLET BY MOUTH DAILY AT BEDTIME  90 tablet  3     patient denies chest pain, shortness of breath, orthopnea. Denies lower extremity edema, abdominal pain, change in appetite, change in bowel movements. Patient denies rashes, musculoskeletal complaints. No other specific complaints in a complete review of systems.   BP 150/94  Pulse 76  Temp(Src) 98.3 F (36.8 C) (Oral)  Ht 5\' 10"  (1.778 m)  Wt 175 lb (79.379 kg)  BMI 25.11 kg/m2  well-developed well-nourished male in no acute distress. HEENT exam atraumatic, normocephalic, neck supple without jugular venous distention. Chest clear to auscultation cardiac exam S1-S2 are regular. Abdominal exam overweight with bowel sounds, soft and nontender. Extremities no edema. Neurologic exam is alert with a normal gait.   A/P-Well  visit- health maint UTD Home BPs average 130/70 Lipids_ total cholesterol 118. Will stop simvastatin for 6 weeks and recheck

## 2012-01-02 ENCOUNTER — Telehealth: Payer: Self-pay | Admitting: *Deleted

## 2012-01-02 DIAGNOSIS — Z0000 Encounter for general adult medical examination without abnormal findings: Secondary | ICD-10-CM

## 2012-01-02 NOTE — Telephone Encounter (Signed)
Pt is calling to have Dr. Cato Mulligan refer him to Dr. Loreta Ave for a colonoscopy in October of this year.  He sent a letter several weeks ago with this request, but has not heard from anyone here.  Please get this referral, and give him a call back.

## 2012-01-08 NOTE — Telephone Encounter (Signed)
Ok per Dr. Swords, referral order placed 

## 2012-02-21 ENCOUNTER — Telehealth: Payer: Self-pay | Admitting: *Deleted

## 2012-02-21 MED ORDER — LISINOPRIL 20 MG PO TABS: 20.0000 mg | ORAL_TABLET | Freq: Every day | ORAL | Status: DC

## 2012-02-21 NOTE — Telephone Encounter (Signed)
rx sent in electronically 

## 2012-02-21 NOTE — Telephone Encounter (Signed)
Pharmacy in [place] [phone] needs  a refill for this pt for 14 days of Lisinopril 20 mg called to the pharmacy above, please. [facility].

## 2012-06-26 ENCOUNTER — Encounter: Payer: Medicare HMO | Admitting: Internal Medicine

## 2012-07-24 DIAGNOSIS — Z23 Encounter for immunization: Secondary | ICD-10-CM | POA: Diagnosis not present

## 2012-08-01 DIAGNOSIS — I495 Sick sinus syndrome: Secondary | ICD-10-CM | POA: Diagnosis not present

## 2012-08-01 DIAGNOSIS — I10 Essential (primary) hypertension: Secondary | ICD-10-CM | POA: Diagnosis not present

## 2012-08-01 DIAGNOSIS — I519 Heart disease, unspecified: Secondary | ICD-10-CM | POA: Diagnosis not present

## 2012-08-01 DIAGNOSIS — I359 Nonrheumatic aortic valve disorder, unspecified: Secondary | ICD-10-CM | POA: Diagnosis not present

## 2012-08-01 DIAGNOSIS — R0789 Other chest pain: Secondary | ICD-10-CM | POA: Diagnosis not present

## 2012-08-01 DIAGNOSIS — Z87891 Personal history of nicotine dependence: Secondary | ICD-10-CM | POA: Diagnosis not present

## 2012-08-01 DIAGNOSIS — M25519 Pain in unspecified shoulder: Secondary | ICD-10-CM | POA: Diagnosis not present

## 2012-08-01 DIAGNOSIS — R55 Syncope and collapse: Secondary | ICD-10-CM | POA: Diagnosis not present

## 2012-08-01 DIAGNOSIS — Z8249 Family history of ischemic heart disease and other diseases of the circulatory system: Secondary | ICD-10-CM | POA: Diagnosis not present

## 2012-08-01 DIAGNOSIS — R079 Chest pain, unspecified: Secondary | ICD-10-CM | POA: Diagnosis not present

## 2012-08-01 DIAGNOSIS — M79609 Pain in unspecified limb: Secondary | ICD-10-CM | POA: Diagnosis not present

## 2012-08-01 DIAGNOSIS — I498 Other specified cardiac arrhythmias: Secondary | ICD-10-CM | POA: Diagnosis not present

## 2012-08-01 DIAGNOSIS — Z8673 Personal history of transient ischemic attack (TIA), and cerebral infarction without residual deficits: Secondary | ICD-10-CM | POA: Diagnosis not present

## 2012-08-05 ENCOUNTER — Encounter: Payer: Self-pay | Admitting: Family Medicine

## 2012-08-05 ENCOUNTER — Ambulatory Visit (INDEPENDENT_AMBULATORY_CARE_PROVIDER_SITE_OTHER): Payer: Medicare Other | Admitting: Family Medicine

## 2012-08-05 VITALS — BP 150/104 | HR 72 | Temp 98.4°F | Wt 178.0 lb

## 2012-08-05 DIAGNOSIS — M5412 Radiculopathy, cervical region: Secondary | ICD-10-CM | POA: Diagnosis not present

## 2012-08-05 NOTE — Patient Instructions (Signed)
-  We placed a referral for you as discussed. It usually takes about 1-2 weeks to process and schedule this referral. If you have not heard from [place] regarding this appointment in 2 weeks please contact our office.  -follow up with your doctor in about 1 month or sooner if worsening or new concerns

## 2012-08-05 NOTE — Progress Notes (Signed)
Chief Complaint  Patient presents with  . pinched nerve on right side    since Fridday; went to ER in [place]- ruled out heart     HPI: Pain in R ant top of chest and down R arm: -can't think of precipitating event -started last week -went to hospital in [place] 4-5 days ago and had large workup to r/o cardiac or other etiologies, told radiculopathy and tx with naproxen -slowly improving since -thinks this is related to improper mechanics at work  -worse with certain positions and certain movements of neck - feels jolt of pain with certain movements -pain is burning/achy down R arm, feels like R hand is a little weak -denies: fevers, chills, numbness -pain in R chest is stabbing and intermittent -hx of this on the L side, had MRI (doe snot want this now), does want referral to PT  ROS: See pertinent positives and negatives per HPI.  Past Medical History  Diagnosis Date  . GERD (gastroesophageal reflux disease)   . Hyperlipidemia   . Hypertension     Family History  Problem Relation Age of Onset  . Heart disease Father   . Heart attack Father   . Hypertension Sister   . Heart attack Brother   . Hypertension Brother     History   Social History  . Marital Status: Married    Spouse Name: N/A    Number of Children: N/A  . Years of Education: N/A   Social History Main Topics  . Smoking status: Former Games developer  . Smokeless tobacco: None  . Alcohol Use: Yes  . Drug Use: No  . Sexually Active: None   Other Topics Concern  . None   Social History Narrative  . None    Current outpatient prescriptions:aspirin 81 MG tablet, Take 81 mg by mouth daily.  , Disp: , Rfl: ;  co-enzyme Q-10 30 MG capsule, Take 30 mg by mouth 3 (three) times daily.  , Disp: , Rfl: ;  fish oil-omega-3 fatty acids 1000 MG capsule, Take 2 g by mouth daily.  , Disp: , Rfl: ;  lisinopril (PRINIVIL,ZESTRIL) 20 MG tablet, Take 1 tablet (20 mg total) by mouth daily., Disp: 14 tablet, Rfl: 0 Multiple  Vitamin (MULTIVITAMIN) tablet, Take 1 tablet by mouth daily.  , Disp: , Rfl:   EXAM:  Filed Vitals:   08/05/12 0924  BP: 150/104  Pulse: 72  Temp: 98.4 F (36.9 C)    There is no height on file to calculate BMI.  GENERAL: vitals reviewed and listed above, alert, oriented, appears well hydrated and in no acute distress  HEENT: atraumatic, conjunttiva clear, no obvious abnormalities on inspection of external nose and ears  MS/NEURO: moves all extremities without noticeable abnormality -normal ROM in neck and in bilat upper extremities -normal muscle strength in upper extremities -normal DTRs and sensation to light touch throughout in both UEs -no TTP of chest muscles, no bony cervical TTP -ttp bilat cervical paraspinal muscles and tension R>L -some pain in arm with spurling  PSYCH: pleasant and cooperative, no obvious depression or anxiety  ASSESSMENT AND PLAN:  Discussed the following assessment and plan:  1. Cervical radiculopathy  Ambulatory referral to Physical Therapy   discussed etiologies and possible workup and treatment options -pt prefers no medications and no imaging at this time and would like to do trial of PT as this worked for him in the past and he reports current symptoms exactly like symptoms in the past -PT referral  placed -return precautions, RTC in 1 month -Patient advised to return or notify a doctor immediately if symptoms worsen or persist or new concerns arise.  Patient Instructions  -We placed a referral for you as discussed. It usually takes about 1-2 weeks to process and schedule this referral. If you have not heard from [place] regarding this appointment in 2 weeks please contact our office.  -follow up with your doctor in about 1 month or sooner if worsening or new concerns     Clifford Seamans R.

## 2012-08-06 ENCOUNTER — Ambulatory Visit: Payer: Medicare Other | Attending: Family Medicine

## 2012-08-06 DIAGNOSIS — R5381 Other malaise: Secondary | ICD-10-CM | POA: Insufficient documentation

## 2012-08-06 DIAGNOSIS — IMO0001 Reserved for inherently not codable concepts without codable children: Secondary | ICD-10-CM | POA: Diagnosis not present

## 2012-08-06 DIAGNOSIS — M25519 Pain in unspecified shoulder: Secondary | ICD-10-CM | POA: Insufficient documentation

## 2012-08-06 DIAGNOSIS — M542 Cervicalgia: Secondary | ICD-10-CM | POA: Insufficient documentation

## 2012-08-10 ENCOUNTER — Ambulatory Visit: Payer: Medicare Other | Admitting: Physical Therapy

## 2012-08-10 ENCOUNTER — Encounter: Payer: Medicare Other | Admitting: Physical Therapy

## 2012-08-10 DIAGNOSIS — M25519 Pain in unspecified shoulder: Secondary | ICD-10-CM | POA: Diagnosis not present

## 2012-08-10 DIAGNOSIS — M542 Cervicalgia: Secondary | ICD-10-CM | POA: Diagnosis not present

## 2012-08-10 DIAGNOSIS — R5381 Other malaise: Secondary | ICD-10-CM | POA: Diagnosis not present

## 2012-08-10 DIAGNOSIS — IMO0001 Reserved for inherently not codable concepts without codable children: Secondary | ICD-10-CM | POA: Diagnosis not present

## 2012-08-13 ENCOUNTER — Ambulatory Visit: Payer: Medicare Other

## 2012-08-13 DIAGNOSIS — M542 Cervicalgia: Secondary | ICD-10-CM | POA: Diagnosis not present

## 2012-08-13 DIAGNOSIS — M25519 Pain in unspecified shoulder: Secondary | ICD-10-CM | POA: Diagnosis not present

## 2012-08-13 DIAGNOSIS — IMO0001 Reserved for inherently not codable concepts without codable children: Secondary | ICD-10-CM | POA: Diagnosis not present

## 2012-08-13 DIAGNOSIS — R5381 Other malaise: Secondary | ICD-10-CM | POA: Diagnosis not present

## 2012-08-17 ENCOUNTER — Other Ambulatory Visit: Payer: Self-pay | Admitting: Internal Medicine

## 2012-08-18 ENCOUNTER — Ambulatory Visit: Payer: Medicare Other | Attending: Family Medicine

## 2012-08-18 DIAGNOSIS — M25519 Pain in unspecified shoulder: Secondary | ICD-10-CM | POA: Insufficient documentation

## 2012-08-18 DIAGNOSIS — IMO0001 Reserved for inherently not codable concepts without codable children: Secondary | ICD-10-CM | POA: Diagnosis not present

## 2012-08-18 DIAGNOSIS — M542 Cervicalgia: Secondary | ICD-10-CM | POA: Insufficient documentation

## 2012-08-18 DIAGNOSIS — R5381 Other malaise: Secondary | ICD-10-CM | POA: Diagnosis not present

## 2012-08-21 ENCOUNTER — Ambulatory Visit: Payer: Medicare Other

## 2012-08-25 ENCOUNTER — Ambulatory Visit: Payer: Medicare Other

## 2012-08-28 ENCOUNTER — Encounter: Payer: Self-pay | Admitting: Internal Medicine

## 2012-08-28 ENCOUNTER — Ambulatory Visit: Payer: Medicare Other

## 2012-09-01 ENCOUNTER — Ambulatory Visit: Payer: Medicare Other

## 2012-09-04 ENCOUNTER — Ambulatory Visit: Payer: Medicare Other

## 2012-09-07 ENCOUNTER — Ambulatory Visit: Payer: Medicare Other | Admitting: Physical Therapy

## 2012-10-06 ENCOUNTER — Encounter: Payer: Self-pay | Admitting: Internal Medicine

## 2012-10-06 ENCOUNTER — Ambulatory Visit (INDEPENDENT_AMBULATORY_CARE_PROVIDER_SITE_OTHER): Payer: Medicare Other | Admitting: Internal Medicine

## 2012-10-06 VITALS — BP 162/94 | HR 60 | Temp 97.7°F | Ht 69.0 in | Wt 176.0 lb

## 2012-10-06 DIAGNOSIS — E041 Nontoxic single thyroid nodule: Secondary | ICD-10-CM

## 2012-10-06 DIAGNOSIS — Z0000 Encounter for general adult medical examination without abnormal findings: Secondary | ICD-10-CM

## 2012-10-06 DIAGNOSIS — Z23 Encounter for immunization: Secondary | ICD-10-CM

## 2012-10-06 DIAGNOSIS — E785 Hyperlipidemia, unspecified: Secondary | ICD-10-CM | POA: Diagnosis not present

## 2012-10-06 DIAGNOSIS — K219 Gastro-esophageal reflux disease without esophagitis: Secondary | ICD-10-CM

## 2012-10-06 DIAGNOSIS — I10 Essential (primary) hypertension: Secondary | ICD-10-CM

## 2012-10-06 LAB — TSH: TSH: 0.24 u[IU]/mL — ABNORMAL LOW (ref 0.35–5.50)

## 2012-10-06 LAB — CBC WITH DIFFERENTIAL/PLATELET
Basophils Absolute: 0 10*3/uL (ref 0.0–0.1)
Basophils Relative: 0.8 % (ref 0.0–3.0)
Eosinophils Absolute: 0.7 10*3/uL (ref 0.0–0.7)
Eosinophils Relative: 14 % — ABNORMAL HIGH (ref 0.0–5.0)
HCT: 44.1 % (ref 39.0–52.0)
Hemoglobin: 15.1 g/dL (ref 13.0–17.0)
Lymphocytes Relative: 24.1 % (ref 12.0–46.0)
Lymphs Abs: 1.2 10*3/uL (ref 0.7–4.0)
MCHC: 34.3 g/dL (ref 30.0–36.0)
MCV: 93.9 fl (ref 78.0–100.0)
Monocytes Absolute: 0.4 10*3/uL (ref 0.1–1.0)
Monocytes Relative: 8 % (ref 3.0–12.0)
Neutro Abs: 2.7 10*3/uL (ref 1.4–7.7)
Neutrophils Relative %: 53.1 % (ref 43.0–77.0)
Platelets: 174 10*3/uL (ref 150.0–400.0)
RBC: 4.7 Mil/uL (ref 4.22–5.81)
RDW: 12.8 % (ref 11.5–14.6)
WBC: 5 10*3/uL (ref 4.5–10.5)

## 2012-10-06 LAB — HEPATIC FUNCTION PANEL
ALT: 19 U/L (ref 0–53)
AST: 26 U/L (ref 0–37)
Albumin: 4.5 g/dL (ref 3.5–5.2)
Alkaline Phosphatase: 44 U/L (ref 39–117)
Bilirubin, Direct: 0 mg/dL (ref 0.0–0.3)
Total Bilirubin: 0.9 mg/dL (ref 0.3–1.2)
Total Protein: 7.4 g/dL (ref 6.0–8.3)

## 2012-10-06 LAB — LIPID PANEL
Cholesterol: 173 mg/dL (ref 0–200)
HDL: 43.1 mg/dL (ref >39.00)
LDL Cholesterol: 116 mg/dL — ABNORMAL HIGH (ref 0–99)
Total CHOL/HDL Ratio: 4
Triglycerides: 71 mg/dL (ref 0.0–149.0)
VLDL: 14.2 mg/dL (ref 0.0–40.0)

## 2012-10-06 LAB — BASIC METABOLIC PANEL
BUN: 11 mg/dL (ref 6–23)
CO2: 26 mEq/L (ref 19–32)
Calcium: 9.6 mg/dL (ref 8.4–10.5)
Chloride: 100 mEq/L (ref 96–112)
Creatinine, Ser: 1 mg/dL (ref 0.4–1.5)
GFR: 84.47 mL/min (ref >60.00)
Glucose, Bld: 102 mg/dL — ABNORMAL HIGH (ref 70–99)
Potassium: 5.3 mEq/L — ABNORMAL HIGH (ref 3.5–5.1)
Sodium: 133 mEq/L — ABNORMAL LOW (ref 135–145)

## 2012-10-06 MED ORDER — LOSARTAN POTASSIUM 100 MG PO TABS: 100.0000 mg | ORAL_TABLET | Freq: Every day | ORAL | Status: DC

## 2012-10-06 NOTE — Assessment & Plan Note (Signed)
Not requiring any meds

## 2012-10-06 NOTE — Progress Notes (Signed)
Subjective:    Clifford Houston is a 66 y.o. male who presents for Medicare Annual/Subsequent preventive examination.  Has had cough on lisinopril-- we will change to losartan  Preventive Screening-Counseling & Management  Tobacco History  Smoking status  . Former Smoker  Smokeless tobacco  . Not on file    Problems Prior to Visit 1.   Current Problems (verified) Patient Active Problem List  Diagnosis  . THYROID NODULE  . HYPERLIPIDEMIA  . HYPERTENSION  . GERD    Medications Prior to Visit Current Outpatient Prescriptions on File Prior to Visit  Medication Sig Dispense Refill  . aspirin 81 MG tablet Take 81 mg by mouth daily.        Marland Kitchen co-enzyme Q-10 30 MG capsule Take 300 mg by mouth daily.       . fish oil-omega-3 fatty acids 1000 MG capsule Take 2 g by mouth daily.        Marland Kitchen lisinopril (PRINIVIL,ZESTRIL) 20 MG tablet TAKE 1 TABLET BY MOUTH ATBEDTIME  90 tablet  0  . Multiple Vitamin (MULTIVITAMIN) tablet Take 1 tablet by mouth 4 (four) times a week.         Current Medications (verified) Current Outpatient Prescriptions  Medication Sig Dispense Refill  . aspirin 81 MG tablet Take 81 mg by mouth daily.        Marland Kitchen co-enzyme Q-10 30 MG capsule Take 300 mg by mouth daily.       . fish oil-omega-3 fatty acids 1000 MG capsule Take 2 g by mouth daily.        Marland Kitchen lisinopril (PRINIVIL,ZESTRIL) 20 MG tablet TAKE 1 TABLET BY MOUTH ATBEDTIME  90 tablet  0  . Multiple Vitamin (MULTIVITAMIN) tablet Take 1 tablet by mouth 4 (four) times a week.          Allergies (verified) Review of patient's allergies indicates no known allergies.   PAST HISTORY  Family History Family History  Problem Relation Age of Onset  . Heart disease Father   . Heart attack Father   . Hypertension Sister   . Heart attack Brother   . Hypertension Brother     Social History History  Substance Use Topics  . Smoking status: Former Games developer  . Smokeless tobacco: Not on file  . Alcohol Use: Yes     Are there smokers in your home (other than you)?  No  Risk Factors Current exercise habits: exercises Dietary issues discussed: he follows a good diet.  Cardiac risk factors: advanced age (older than 72 for men, 63 for women), dyslipidemia, hypertension and male gender.  Depression Screen see screening (Note: if answer to either of the following is "Yes", a more complete depression screening is indicated)    Activities of Daily Living In your present state of health, do you have any difficulty performing the following activities?:  Driving? No Managing money?  No   Hearing Difficulties: No Do you often ask people to speak up or repeat themselves? No  Cognitive Testing  Alert? Yes  Normal Appearance?Yes     List the Names of Other Physician/Practitioners you currently use: 1.    Indicate any recent Medical Services you may have received from other than [facility] providers in the past year (date may be approximate).  Immunization History  Administered Date(s) Administered  . Influenza Split 07/14/2012  . Td 01/08/2010  . Zoster 01/08/2010    Screening Tests Health Maintenance  Topic Date Due  . Pneumococcal Polysaccharide Vaccine Age 4 And  Over  06/01/2012  . Influenza Vaccine  06/14/2012  . Colonoscopy  12/27/2012  . Tetanus/tdap  01/09/2020  . Zostavax  Completed    All answers were reviewed with the patient and necessary referrals were made:  Judie Petit, MD   10/06/2012   History reviewed: allergies, current medications, past family history, past medical history, past social history, past surgical history and problem list  Review of Systems   patient denies chest pain, shortness of breath, orthopnea. Denies lower extremity edema, abdominal pain, change in appetite, change in bowel movements. Patient denies rashes, musculoskeletal complaints. No other specific complaints in a complete review of systems.    Objective:      Blood pressure 162/94, pulse  60, temperature 97.7 F (36.5 C), temperature source Oral, height 5\' 9"  (1.753 m), weight 176 lb (79.833 kg). Body mass index is 25.99 kg/(m^2).   well-developed well-nourished male in no acute distress. HEENT exam atraumatic, normocephalic, neck supple without jugular venous distention. Chest clear to auscultation cardiac exam S1-S2 are regular. Abdominal exam overweight with bowel sounds, soft and nontender. Extremities no edema. Neurologic exam is alert with a normal gait.      Assessment:   Well visit     Plan:  Health maint UTD   During the course of the visit the patient was educated and counseled about appropriate screening and preventive services including:    Pneumococcal vaccine   Influenza vaccine  Td vaccine  Prostate cancer screening  Colorectal cancer screening  Diet review for nutrition referral? Yes ____  Not Indicated x____   Patient Instructions (the written plan) was given to the patient.   Judie Petit, MD   10/06/2012

## 2012-10-06 NOTE — Assessment & Plan Note (Signed)
Home bps- 130/80s Continue same meds

## 2012-10-20 ENCOUNTER — Telehealth: Payer: Self-pay | Admitting: Internal Medicine

## 2012-10-20 NOTE — Telephone Encounter (Signed)
Ok to check PSA

## 2012-10-20 NOTE — Telephone Encounter (Signed)
Labs scheduled  

## 2012-10-20 NOTE — Telephone Encounter (Signed)
Patient called stating he needed to redo his bmp that was scheduled and patient would like to know if he needs a psa and also if he need to redo the tsh. Patient would like a call with advice. Lab scheduled for 11/02/12.

## 2012-11-03 ENCOUNTER — Other Ambulatory Visit (INDEPENDENT_AMBULATORY_CARE_PROVIDER_SITE_OTHER): Payer: Medicare Other

## 2012-11-03 DIAGNOSIS — N400 Enlarged prostate without lower urinary tract symptoms: Secondary | ICD-10-CM

## 2012-11-03 DIAGNOSIS — I10 Essential (primary) hypertension: Secondary | ICD-10-CM | POA: Diagnosis not present

## 2012-11-03 DIAGNOSIS — E039 Hypothyroidism, unspecified: Secondary | ICD-10-CM | POA: Diagnosis not present

## 2012-11-03 LAB — BASIC METABOLIC PANEL
BUN: 12 mg/dL (ref 6–23)
CO2: 27 mEq/L (ref 19–32)
Calcium: 9.6 mg/dL (ref 8.4–10.5)
Chloride: 105 mEq/L (ref 96–112)
Creatinine, Ser: 0.9 mg/dL (ref 0.4–1.5)
GFR: 87.64 mL/min (ref >60.00)
Glucose, Bld: 104 mg/dL — ABNORMAL HIGH (ref 70–99)
Potassium: 5.6 mEq/L — ABNORMAL HIGH (ref 3.5–5.1)
Sodium: 137 mEq/L (ref 135–145)

## 2012-11-03 LAB — T4, FREE: Free T4: 0.83 ng/dL (ref 0.60–1.60)

## 2012-11-03 LAB — PSA: PSA: 0.73 ng/mL (ref 0.10–4.00)

## 2012-11-03 LAB — TSH: TSH: 0.52 u[IU]/mL (ref 0.35–5.50)

## 2012-11-03 LAB — T3, FREE: T3, Free: 2.8 pg/mL (ref 2.3–4.2)

## 2012-11-04 ENCOUNTER — Other Ambulatory Visit: Payer: Self-pay | Admitting: *Deleted

## 2012-11-04 MED ORDER — AMLODIPINE BESYLATE 5 MG PO TABS: 5.0000 mg | ORAL_TABLET | Freq: Every day | ORAL | Status: DC

## 2012-11-06 ENCOUNTER — Encounter: Payer: Self-pay | Admitting: Internal Medicine

## 2012-11-13 DIAGNOSIS — D239 Other benign neoplasm of skin, unspecified: Secondary | ICD-10-CM | POA: Diagnosis not present

## 2012-11-13 DIAGNOSIS — L819 Disorder of pigmentation, unspecified: Secondary | ICD-10-CM | POA: Diagnosis not present

## 2012-11-13 DIAGNOSIS — L821 Other seborrheic keratosis: Secondary | ICD-10-CM | POA: Diagnosis not present

## 2012-11-13 DIAGNOSIS — Z808 Family history of malignant neoplasm of other organs or systems: Secondary | ICD-10-CM | POA: Diagnosis not present

## 2012-11-13 DIAGNOSIS — Z85828 Personal history of other malignant neoplasm of skin: Secondary | ICD-10-CM | POA: Diagnosis not present

## 2012-11-13 DIAGNOSIS — D1801 Hemangioma of skin and subcutaneous tissue: Secondary | ICD-10-CM | POA: Diagnosis not present

## 2012-12-07 ENCOUNTER — Other Ambulatory Visit (INDEPENDENT_AMBULATORY_CARE_PROVIDER_SITE_OTHER): Payer: Medicare Other

## 2012-12-07 DIAGNOSIS — I10 Essential (primary) hypertension: Secondary | ICD-10-CM | POA: Diagnosis not present

## 2012-12-07 LAB — BASIC METABOLIC PANEL
Chloride: 104 mEq/L (ref 96–112)
Creatinine, Ser: 0.9 mg/dL (ref 0.4–1.5)
GFR: 89.87 mL/min (ref >60.00)
Potassium: 4.8 mEq/L (ref 3.5–5.1)

## 2012-12-29 DIAGNOSIS — K219 Gastro-esophageal reflux disease without esophagitis: Secondary | ICD-10-CM | POA: Diagnosis not present

## 2012-12-29 DIAGNOSIS — K5730 Diverticulosis of large intestine without perforation or abscess without bleeding: Secondary | ICD-10-CM | POA: Diagnosis not present

## 2012-12-29 DIAGNOSIS — Z1211 Encounter for screening for malignant neoplasm of colon: Secondary | ICD-10-CM | POA: Diagnosis not present

## 2013-03-12 DIAGNOSIS — K6389 Other specified diseases of intestine: Secondary | ICD-10-CM | POA: Diagnosis not present

## 2013-03-12 DIAGNOSIS — Z1211 Encounter for screening for malignant neoplasm of colon: Secondary | ICD-10-CM | POA: Diagnosis not present

## 2013-03-12 DIAGNOSIS — K648 Other hemorrhoids: Secondary | ICD-10-CM | POA: Diagnosis not present

## 2013-03-12 DIAGNOSIS — D126 Benign neoplasm of colon, unspecified: Secondary | ICD-10-CM | POA: Diagnosis not present

## 2013-03-16 DIAGNOSIS — H2510 Age-related nuclear cataract, unspecified eye: Secondary | ICD-10-CM | POA: Diagnosis not present

## 2013-05-18 LAB — BASIC METABOLIC PANEL
BUN: 13 mg/dL (ref 4–21)
CREATININE: 1 mg/dL (ref <=1.3)
Glucose: 77 mg/dL

## 2013-05-18 LAB — LIPID PANEL
CHOLESTEROL: 174 mg/dL (ref 0–200)
HDL: 43 mg/dL (ref 35–70)
LDL CALC: 114 mg/dL
Triglycerides: 84 mg/dL (ref 40–160)

## 2013-05-18 LAB — HEPATIC FUNCTION PANEL
ALT: 17 U/L (ref 10–40)
AST: 25 U/L (ref 14–40)
Alkaline Phosphatase: 56 U/L (ref 25–125)
Bilirubin, Total: 0.7 mg/dL

## 2013-07-12 DIAGNOSIS — Z23 Encounter for immunization: Secondary | ICD-10-CM | POA: Diagnosis not present

## 2013-07-21 ENCOUNTER — Telehealth: Payer: Self-pay | Admitting: *Deleted

## 2013-07-21 DIAGNOSIS — E041 Nontoxic single thyroid nodule: Secondary | ICD-10-CM

## 2013-07-21 NOTE — Telephone Encounter (Signed)
Pt is trying to get life insurance and on 07/11/07 he had bilateral thyroid nodules.  He found out through his life insurance that on 01/12/10 he had a "new nodule" and was not aware of it.  He needs a note from Dr Cato Mulligan stating that this was a  documentation error, however upon reviewing his chart Dr Cato Mulligan documented "call patient.  Thyroid ultrasound appears okay.  He has multiple nodules in the thyroid consistent with multinodular goiter.  We will likely repeat ultrasound in one year." Pt was unaware of this and there has not been a repeat ultrasound since.  Pt wants to know what the next step is.  Please advise (had to go to [facility] to find Dr Cato Mulligan note)

## 2013-07-21 NOTE — Telephone Encounter (Addendum)
error 

## 2013-07-22 NOTE — Telephone Encounter (Signed)
Left message for pt to call back  °

## 2013-07-22 NOTE — Telephone Encounter (Signed)
This is not a documentation error. Pt had bx of thyroid in 2007-- was unremarkable.   It has been 3 years since ultrasound-- ok to order ultrasound of thyroid

## 2013-07-22 NOTE — Telephone Encounter (Signed)
Pt returned your call and will have phone w/ him rest of day. pls call.

## 2013-07-26 ENCOUNTER — Telehealth: Payer: Self-pay | Admitting: Internal Medicine

## 2013-07-26 NOTE — Telephone Encounter (Signed)
Pt aware, referral order placed for u/s

## 2013-07-26 NOTE — Telephone Encounter (Signed)
See previous note

## 2013-07-26 NOTE — Telephone Encounter (Signed)
Pt is returning cindy call °

## 2013-07-26 NOTE — Addendum Note (Signed)
Addended by: Alfred Levins D on: 07/26/2013 11:04 AM   Modules accepted: Orders

## 2013-07-27 ENCOUNTER — Ambulatory Visit
Admission: RE | Admit: 2013-07-27 | Discharge: 2013-07-27 | Disposition: A | Discharge status: Home / Self Care | Payer: Medicare Other | Source: Ambulatory Visit | Attending: Internal Medicine | Admitting: Internal Medicine

## 2013-07-27 DIAGNOSIS — E041 Nontoxic single thyroid nodule: Secondary | ICD-10-CM

## 2013-07-27 DIAGNOSIS — E042 Nontoxic multinodular goiter: Secondary | ICD-10-CM | POA: Diagnosis not present

## 2013-07-28 ENCOUNTER — Telehealth: Payer: Self-pay | Admitting: Internal Medicine

## 2013-07-28 NOTE — Telephone Encounter (Signed)
Pt is calling to inquire about [place] results from 07/26/13. Please assist.

## 2013-07-29 NOTE — Telephone Encounter (Signed)
Told pt thyroid nodules were stable

## 2013-07-30 ENCOUNTER — Encounter: Payer: Self-pay | Admitting: Internal Medicine

## 2013-07-30 ENCOUNTER — Ambulatory Visit (INDEPENDENT_AMBULATORY_CARE_PROVIDER_SITE_OTHER): Payer: Medicare Other | Admitting: Internal Medicine

## 2013-07-30 VITALS — BP 160/90 | HR 66 | Temp 98.1°F | Resp 20 | Wt 176.0 lb

## 2013-07-30 DIAGNOSIS — I10 Essential (primary) hypertension: Secondary | ICD-10-CM | POA: Diagnosis not present

## 2013-07-30 DIAGNOSIS — K4090 Unilateral inguinal hernia, without obstruction or gangrene, not specified as recurrent: Secondary | ICD-10-CM

## 2013-07-30 NOTE — Patient Instructions (Signed)
Hernia A hernia occurs when an internal organ pushes out through a weak spot in the abdominal wall. Hernias most commonly occur in the groin and around the navel. Hernias often can be pushed back into place (reduced). Most hernias tend to get worse over time. Some abdominal hernias can get stuck in the opening (irreducible or incarcerated hernia) and cannot be reduced. An irreducible abdominal hernia which is tightly squeezed into the opening is at risk for impaired blood supply (strangulated hernia). A strangulated hernia is a medical emergency. Because of the risk for an irreducible or strangulated hernia, surgery may be recommended to repair a hernia. CAUSES   Heavy lifting.  Prolonged coughing.  Straining to have a bowel movement.  A cut (incision) made during an abdominal surgery. HOME CARE INSTRUCTIONS   Bed rest is not required. You may continue your normal activities.  Avoid lifting more than 10 pounds (4.5 kg) or straining.  Cough gently. If you are a smoker it is best to stop. Even the best hernia repair can break down with the continual strain of coughing. Even if you do not have your hernia repaired, a cough will continue to aggravate the problem.  Do not wear anything tight over your hernia. Do not try to keep it in with an outside bandage or truss. These can damage abdominal contents if they are trapped within the hernia sac.  Eat a normal diet.  Avoid constipation. Straining over long periods of time will increase hernia size and encourage breakdown of repairs. If you cannot do this with diet alone, stool softeners may be used. SEEK IMMEDIATE MEDICAL CARE IF:   You have a fever.  You develop increasing abdominal pain.  You feel nauseous or vomit.  Your hernia is stuck outside the abdomen, looks discolored, feels hard, or is tender.  You have any changes in your bowel habits or in the hernia that are unusual for you.  You have increased pain or swelling around the  hernia.  You cannot push the hernia back in place by applying gentle pressure while lying down. MAKE SURE YOU:   Understand these instructions.  Will watch your condition.  Will get help right away if you are not doing well or get worse. Document Released: 09/30/2005 Document Revised: 12/23/2011 Document Reviewed: 05/19/2008 [facility] Patient Information 2014 [place], [place]. Hernia, Surgical Repair A hernia occurs when an internal organ pushes out through a weak spot in the belly (abdominal) wall muscles. Hernias commonly occur in the groin and around the navel. Hernias often can be pushed back into place (reduced). Most hernias tend to get worse over time. Problems occur when abdominal contents get stuck in the opening (incarcerated hernia). The blood supply gets cut off (strangulated hernia). This is an emergency and needs surgery. Otherwise, hernia repair can be an elective procedure. This means you can schedule this at your convenience when an emergency is not present. Because complications can occur, if you decide to repair the hernia, it is best to do it soon. When it becomes an emergency procedure, there is increased risk of complications after surgery. CAUSES   Heavy lifting.  Obesity.  Prolonged coughing.  Straining to move your bowels.  Hernias can also occur through a cut (incision) by a surgeonafter an abdominal operation. HOME CARE INSTRUCTIONS Before the repair:  Bed rest is not required. You may continue your normal activities, but avoid heavy lifting (more than 10 pounds) or straining. Cough gently. If you are a smoker, it is best  to stop. Even the best hernia repair can break down with the continual strain of coughing.  Do not wear anything tight over your hernia. Do not try to keep it in with an outside bandage or truss. These can damage abdominal contents if they are trapped in the hernia sac.  Eat a normal diet. Avoid constipation. Straining over long periods of  time to have a bowel movement will increase hernia size. It also can breakdown repairs. If you cannot do this with diet alone, laxatives or stool softeners may be used. PRIOR TO SURGERY, SEEK IMMEDIATE MEDICAL CARE IF: You have problems (symptoms) of a trapped (incarcerated) hernia. Symptoms include:  An oral temperature above 102 F (38.9 C) develops, or as your caregiver suggests.  Increasing abdominal pain.  Feeling sick to your stomach(nausea) and vomiting.  You stop passing gas or stool.  The hernia is stuck outside the abdomen, looks discolored, feels hard, or is tender.  You have any changes in your bowel habits or in the hernia that is unusual for you. LET YOUR CAREGIVERS KNOW ABOUT THE FOLLOWING:  Allergies.  Medications taken including herbs, eye drops, over the counter medications, and creams.  Use of steroids (by mouth or creams).  Family or personal history of problems with anesthetics or Novocaine.  Possibility of pregnancy, if this applies.  Personal history of blood clots (thrombophlebitis).  Family or personal history of bleeding or blood problems.  Previous surgery.  Other health problems. BEFORE THE PROCEDURE You should be present 1 hour prior to your procedure, or as directed by your caregiver.  AFTER THE PROCEDURE After surgery, you will be taken to the recovery area. A nurse will watch and check your progress there. Once you are awake, stable, and taking fluids well, you will be allowed to go home as long as there are no problems. Once home, an ice pack (wrapped in a light towel) applied to your operative site may help with discomfort. It may also keep the swelling down. Do not lift anything heavier than 10 pounds (4.55 kilograms). Take showers not baths. Do not drive while taking narcotics. Follow instructions as suggested by your caregiver.  SEEK IMMEDIATE MEDICAL CARE IF: After surgery:  There is redness, swelling, or increasing pain in the  wound.  There is pus coming from the wound.  There is drainage from a wound lasting longer than 1 day.  An unexplained oral temperature above 102 F (38.9 C) develops.  You notice a foul smell coming from the wound or dressing.  There is a breaking open of a wound (edged not staying together) after the sutures have been removed.  You notice increasing pain in the shoulders (shoulder strap areas).  You develop dizzy episodes or fainting while standing.  You develop persistent nausea or vomiting.  You develop a rash.  You have difficulty breathing.  You develop any reaction or side effects to medications given. MAKE SURE YOU:   Understand these instructions.  Will watch your condition.  Will get help right away if you are not doing well or get worse. Document Released: 03/26/2001 Document Revised: 12/23/2011 Document Reviewed: 02/16/2008 [facility] Patient Information 2014 [place], [place].

## 2013-07-30 NOTE — Progress Notes (Signed)
Subjective:    Patient ID: Clifford Houston, male    DOB: 03-13-1946, 67 y.o.   MRN: [id number]  HPI  67 year old patient who has a history of treated hypertension. He does monitor home blood pressure readings that are variable but usually well controlled. He states that he is consistently high in the office and blood pressure on arrival here today 160/90. Repeat blood pressure 150/90  The past one month he has had some discomfort in the left groin area and over the past week has noted an intermittent bulge. He notices this most often at the gym. This is the chief complaint today. It does not interfere with his daily activities and the pain is described as minor  Past Medical History  Diagnosis Date  . GERD (gastroesophageal reflux disease)   . Hyperlipidemia   . Hypertension     History   Social History  . Marital Status: Married    Spouse Name: N/A    Number of Children: N/A  . Years of Education: N/A   Occupational History  . Not on file.   Social History Main Topics  . Smoking status: Former Games developer  . Smokeless tobacco: Not on file  . Alcohol Use: Yes  . Drug Use: No  . Sexual Activity: Not on file   Other Topics Concern  . Not on file   Social History Narrative  . No narrative on file    Past Surgical History  Procedure Laterality Date  . Thyroid surgery      Family History  Problem Relation Age of Onset  . Heart disease Father   . Heart attack Father   . Hypertension Sister   . Heart attack Brother   . Hypertension Brother     No Known Allergies  Current Outpatient Prescriptions on File Prior to Visit  Medication Sig Dispense Refill  . amLODipine (NORVASC) 5 MG tablet Take 1 tablet (5 mg total) by mouth daily.  90 tablet  3  . aspirin 81 MG tablet Take 81 mg by mouth daily.        Marland Kitchen co-enzyme Q-10 30 MG capsule Take 300 mg by mouth daily.       . fish oil-omega-3 fatty acids 1000 MG capsule Take 2 g by mouth daily.        . Multiple Vitamin  (MULTIVITAMIN) tablet Take 1 tablet by mouth 4 (four) times a week.        No current facility-administered medications on file prior to visit.    BP 160/90  Pulse 66  Temp(Src) 98.1 F (36.7 C) (Oral)  Resp 20  Wt 176 lb (79.833 kg)  BMI 25.98 kg/m2  SpO2 98%       Review of Systems  Constitutional: Negative for fever, chills, appetite change and fatigue.  HENT: Negative for congestion, dental problem, ear pain, hearing loss, sore throat, tinnitus, trouble swallowing and voice change.   Eyes: Negative for pain, discharge and visual disturbance.  Respiratory: Negative for cough, chest tightness, wheezing and stridor.   Cardiovascular: Negative for chest pain, palpitations and leg swelling.  Gastrointestinal: Negative for nausea, vomiting, abdominal pain, diarrhea, constipation, blood in stool and abdominal distention.  Genitourinary: Negative for urgency, hematuria, flank pain, discharge, difficulty urinating and genital sores.  Musculoskeletal: Negative for arthralgias, back pain, gait problem, joint swelling, myalgias and neck stiffness.  Skin: Negative for rash.  Neurological: Negative for dizziness, syncope, speech difficulty, weakness, numbness and headaches.  Hematological: Negative for adenopathy. Does not bruise/bleed easily.  Psychiatric/Behavioral: Negative for behavioral problems and dysphoric mood. The patient is not nervous/anxious.        Objective:   Physical Exam  Constitutional: He appears well-developed and well-nourished. No distress.  Repeat blood pressure 150/90  Abdominal: He exhibits mass.  Left inguinal hernia easily reducible          Assessment & Plan:   Left inguinal hernia. Mildly symptomatic. The patient was given information concerning hernias and hernia repair. He will consider surgical referral if symptoms intensify or hernia enlarges

## 2013-09-28 ENCOUNTER — Other Ambulatory Visit: Payer: Self-pay | Admitting: Internal Medicine

## 2013-10-08 ENCOUNTER — Encounter: Payer: Medicare Other | Admitting: Internal Medicine

## 2013-10-29 ENCOUNTER — Encounter: Payer: Self-pay | Admitting: Internal Medicine

## 2013-10-29 ENCOUNTER — Ambulatory Visit (INDEPENDENT_AMBULATORY_CARE_PROVIDER_SITE_OTHER): Payer: Medicare Other | Admitting: Internal Medicine

## 2013-10-29 VITALS — BP 148/85 | HR 72 | Temp 98.5°F | Ht 70.5 in | Wt 175.0 lb

## 2013-10-29 DIAGNOSIS — Z0000 Encounter for general adult medical examination without abnormal findings: Secondary | ICD-10-CM | POA: Diagnosis not present

## 2013-10-29 MED ORDER — AMLODIPINE BESYLATE 10 MG PO TABS: 10.0000 mg | ORAL_TABLET | Freq: Every day | ORAL | Status: DC

## 2013-10-29 NOTE — Progress Notes (Signed)
Pre visit review using our clinic review tool, if applicable. No additional management support is needed unless otherwise documented below in the visit note. 

## 2013-10-29 NOTE — Patient Instructions (Signed)
Monitor your blood pressure.  Let me know if blood pressures consistently above 150/90

## 2013-10-31 NOTE — Progress Notes (Signed)
cpx  Past Medical History  Diagnosis Date  . GERD (gastroesophageal reflux disease)   . Hyperlipidemia   . Hypertension     History   Social History  . Marital Status: Married    Spouse Name: N/A    Number of Children: N/A  . Years of Education: N/A   Occupational History  . Not on file.   Social History Main Topics  . Smoking status: Former Research scientist (life sciences)  . Smokeless tobacco: Not on file  . Alcohol Use: Yes  . Drug Use: No  . Sexual Activity: Not on file   Other Topics Concern  . Not on file   Social History Narrative  . No narrative on file    Past Surgical History  Procedure Laterality Date  . Thyroid surgery      Family History  Problem Relation Age of Onset  . Heart disease Father   . Heart attack Father   . Hypertension Sister   . Heart attack Brother   . Hypertension Brother     Allergies  Allergen Reactions  . Lisinopril     Mild cough    Current Outpatient Prescriptions on File Prior to Visit  Medication Sig Dispense Refill  . aspirin 81 MG tablet Take 81 mg by mouth daily.        Marland Kitchen co-enzyme Q-10 30 MG capsule Take 300 mg by mouth daily.       . fish oil-omega-3 fatty acids 1000 MG capsule Take 2 g by mouth daily.        . Multiple Vitamin (MULTIVITAMIN) tablet Take 1 tablet by mouth 4 (four) times a week.       . Red Yeast Rice Extract (RED YEAST RICE PO) Take 1,200 mg by mouth daily.       No current facility-administered medications on file prior to visit.     patient denies chest pain, shortness of breath, orthopnea. Denies lower extremity edema, abdominal pain, change in appetite, change in bowel movements. Patient denies rashes, musculoskeletal complaints. No other specific complaints in a complete review of systems.   BP 148/85  Pulse 72  Temp(Src) 98.5 F (36.9 C) (Oral)  Ht 5' 10.5" (1.791 m)  Wt 175 lb (79.379 kg)  BMI 24.75 kg/m2 Well-developed male in no acute distress. HEENT exam atraumatic, normocephalic, extraocular muscles  are intact. Conjunctivae are pink without exudate. Neck is supple without lymphadenopathy, thyromegaly, jugular venous distention. Chest is clear to auscultation without increased work of breathing. Cardiac exam S1-S2 are regular. The PMI is normal. No significant murmurs or gallops. Abdominal exam active bowel sounds, soft, nontender. No abdominal bruits. Extremities no clubbing cyanosis or edema. Peripheral pulses are normal without bruits. Neurologic exam alert and oriented without any motor or sensory deficits. Rectal exam normal tone prostate normal size without masses or asymmetry.  Well visit- health miant utd

## 2013-11-03 ENCOUNTER — Encounter: Payer: Medicare Other | Admitting: Internal Medicine

## 2013-11-08 ENCOUNTER — Encounter: Payer: Self-pay | Admitting: Internal Medicine

## 2013-11-16 DIAGNOSIS — D1801 Hemangioma of skin and subcutaneous tissue: Secondary | ICD-10-CM | POA: Diagnosis not present

## 2013-11-16 DIAGNOSIS — L819 Disorder of pigmentation, unspecified: Secondary | ICD-10-CM | POA: Diagnosis not present

## 2013-11-16 DIAGNOSIS — L219 Seborrheic dermatitis, unspecified: Secondary | ICD-10-CM | POA: Diagnosis not present

## 2013-11-16 DIAGNOSIS — D239 Other benign neoplasm of skin, unspecified: Secondary | ICD-10-CM | POA: Diagnosis not present

## 2013-11-16 DIAGNOSIS — Z85828 Personal history of other malignant neoplasm of skin: Secondary | ICD-10-CM | POA: Diagnosis not present

## 2013-11-16 DIAGNOSIS — L821 Other seborrheic keratosis: Secondary | ICD-10-CM | POA: Diagnosis not present

## 2014-05-05 IMAGING — US US SOFT TISSUE HEAD/NECK
1 series · 14 of 25 positions shown · non-contrast
Comparison: 01/12/2010.

CLINICAL DATA: Followup thyroid nodules.

EXAM:
THYROID ULTRASOUND
TECHNIQUE: Ultrasound examination of the thyroid gland and adjacent soft
tissues was performed.

[Series 1: us soft tissue head/neck · 0.13mm/px · 14 of 65 slices shown]
[im 1/65]
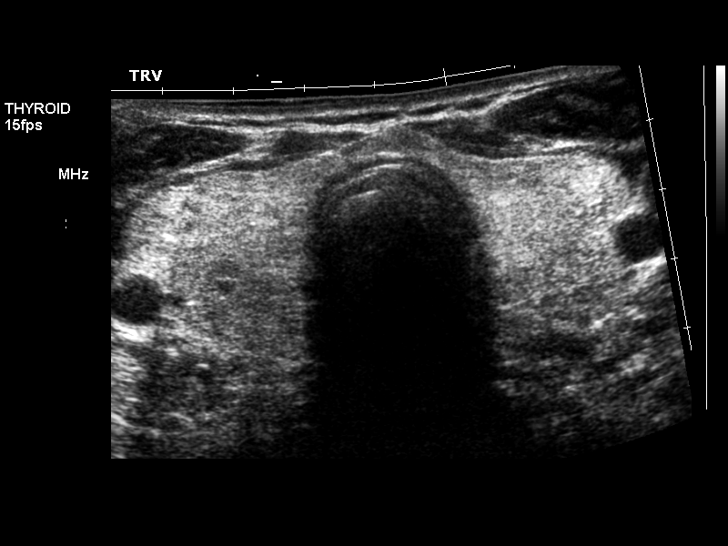
[im 6/65]
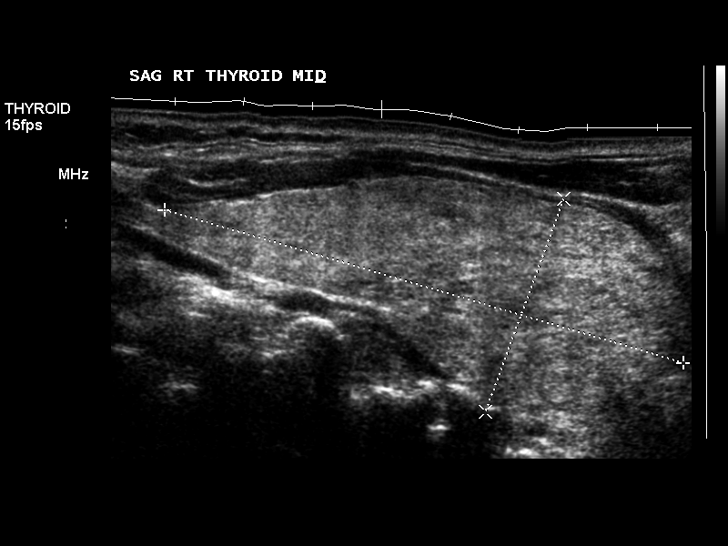
[im 11/65]
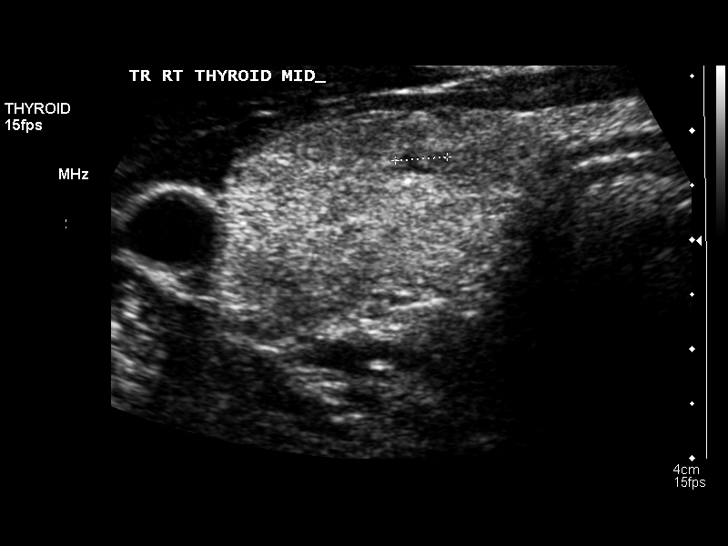
[im 17/65]
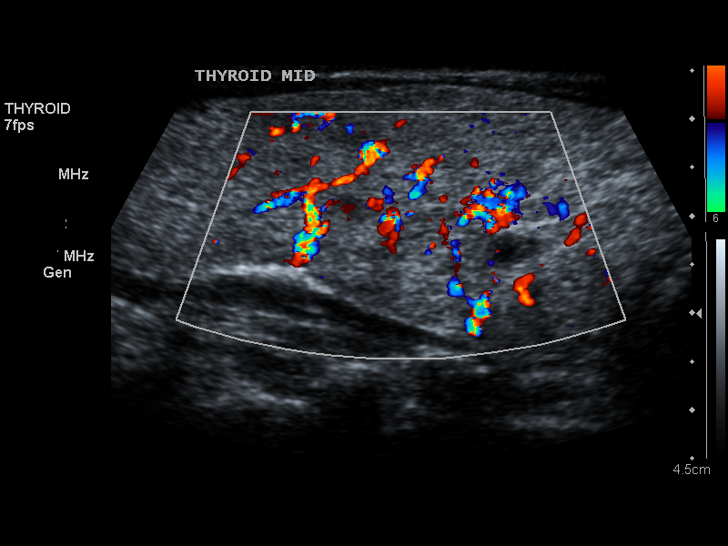
[im 22/65]
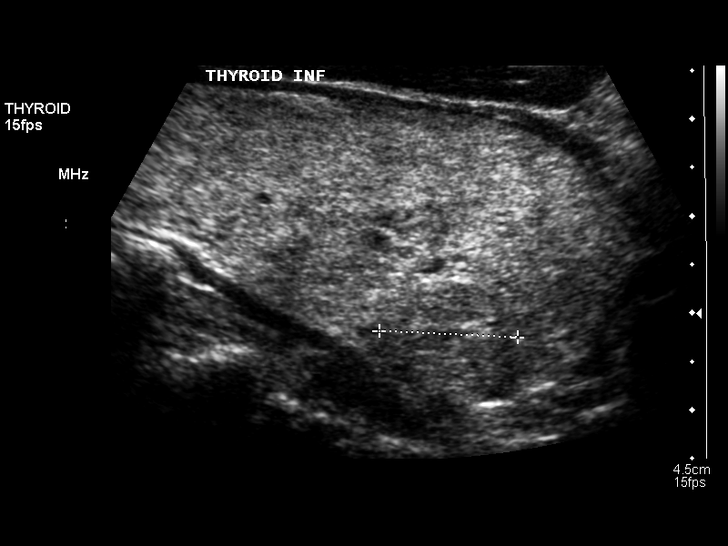
[im 25/65]
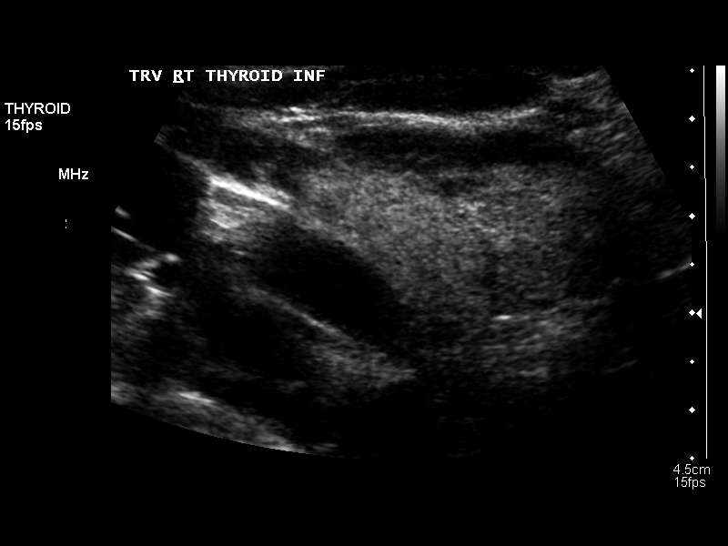
[im 30/65]
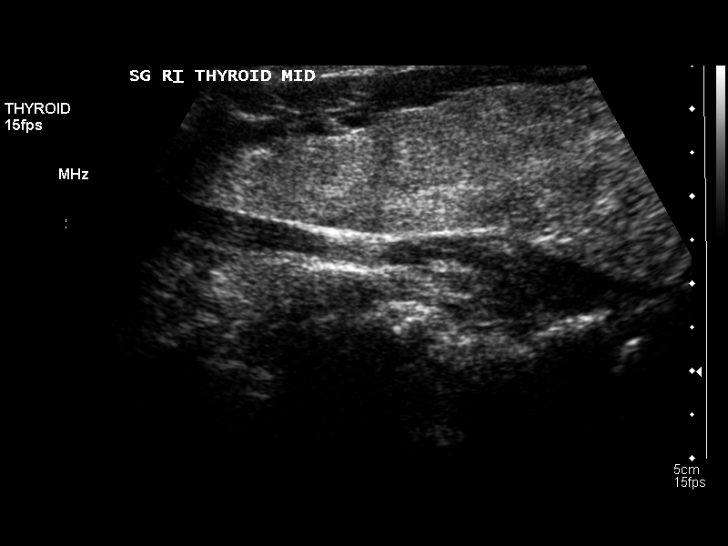
[im 35/65]
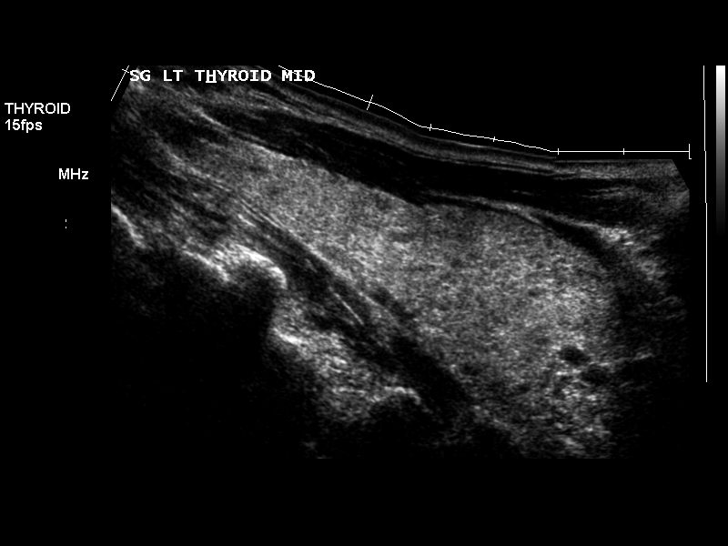
[im 41/65]
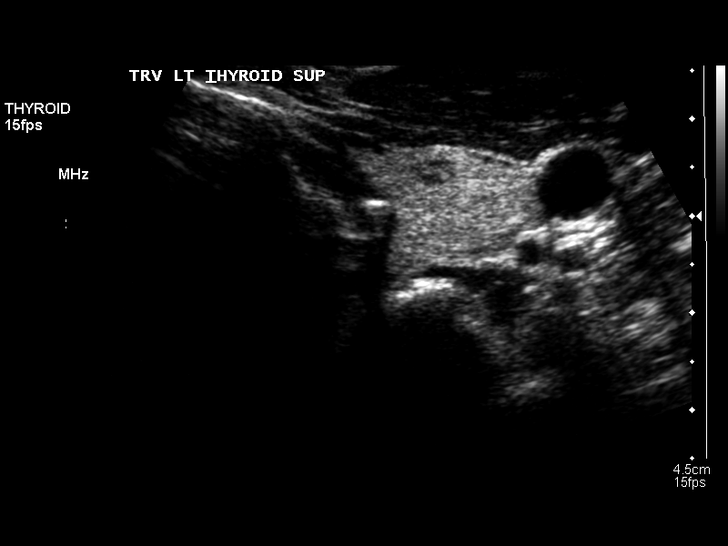
[im 43/65]
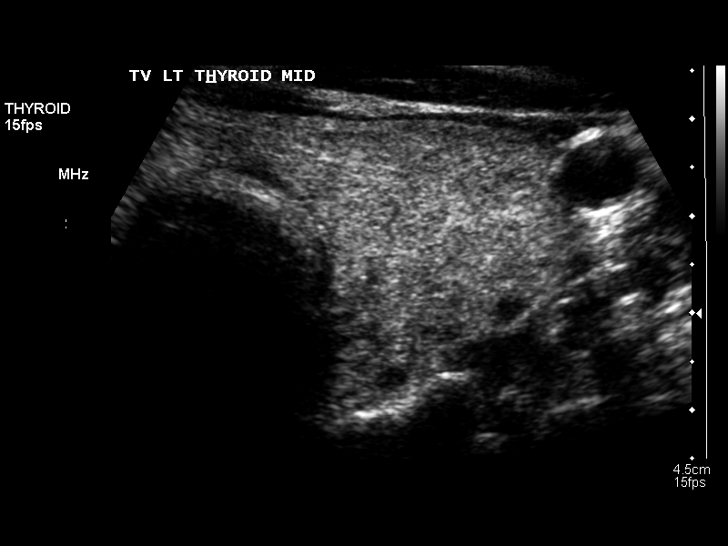
[im 49/65]
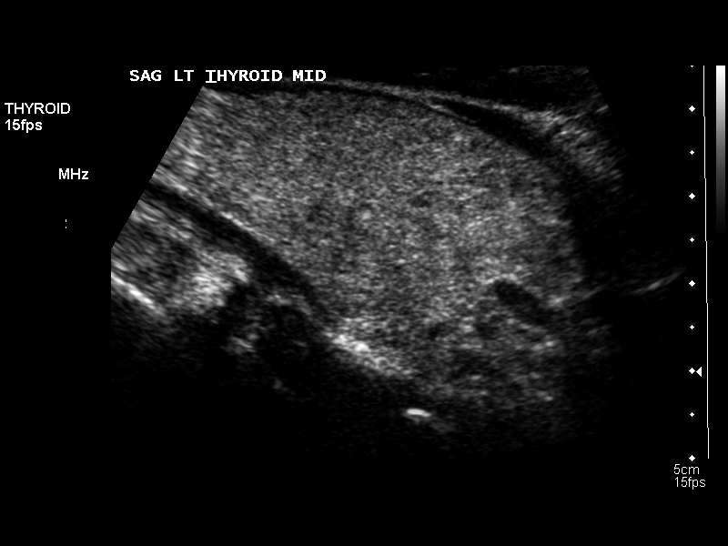
[im 54/65]
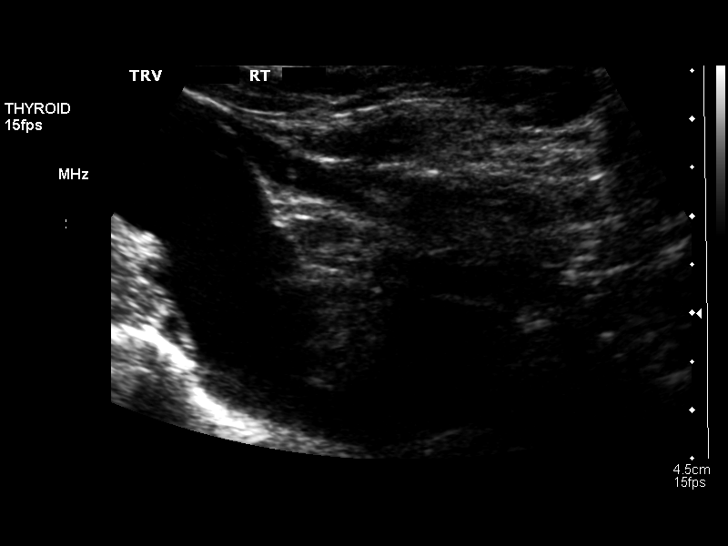
[im 59/65]
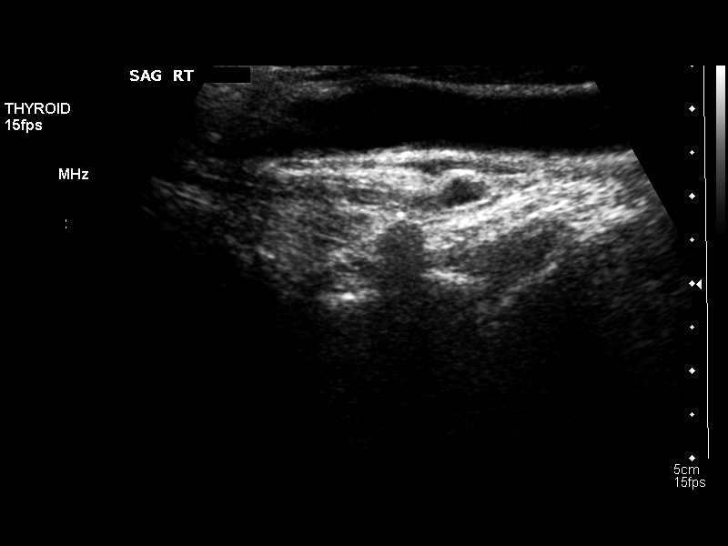
[im 65/65]
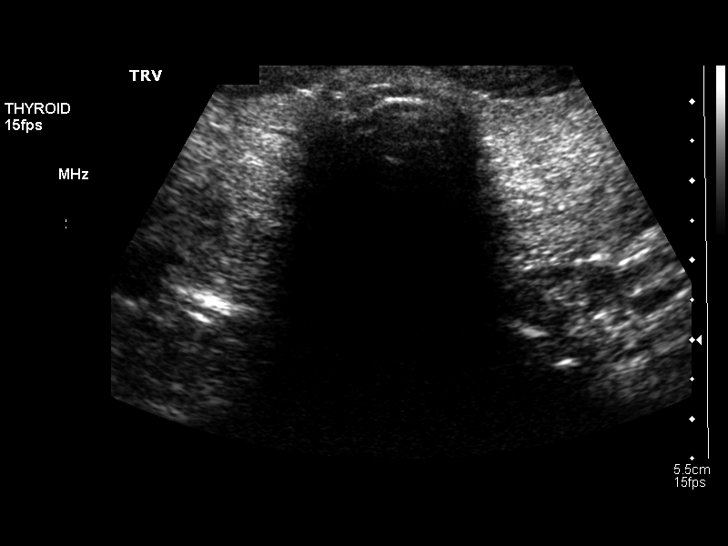

[14 of 25 positions shown; findings below may reference images not displayed]

FINDINGS: Right thyroid lobe

Measurements: 7.8 x 3.3 x 2.8 cm.. Multiple stable thyroid nodules.
The largest measures 1.5 x 1.3 x 1.3 cm.

Left thyroid lobe

Measurements: 7.7 x 2.9 x 2.5 cm. Small stable thyroid nodules. The
largest measures 7.5 mm in the lower pole region.

Isthmus

Thickness: 0.64 cm..  No nodules visualized.

Lymphadenopathy

None visualized.
IMPRESSION: Stable multinodular thyroid goiter.

## 2014-05-12 ENCOUNTER — Encounter: Payer: Self-pay | Admitting: Internal Medicine

## 2014-07-12 ENCOUNTER — Other Ambulatory Visit: Payer: Self-pay | Admitting: Internal Medicine

## 2014-07-12 MED ORDER — AMLODIPINE BESYLATE 10 MG PO TABS: 10.0000 mg | ORAL_TABLET | Freq: Every day | ORAL | Status: DC

## 2014-07-12 NOTE — Telephone Encounter (Signed)
Medication sent into [facility]. 

## 2014-07-12 NOTE — Telephone Encounter (Signed)
[place] is requesting re-fill on amLODipine (NORVASC) 10 MG tablet

## 2014-07-12 NOTE — Telephone Encounter (Signed)
Pt saw Dr Luther Redo on 10/2013 and has a CPE scheduled with you for 10/2014, is this ok to refill? Or does pt need to come in sooner for med check?

## 2014-07-12 NOTE — Telephone Encounter (Signed)
Yes that's fine. The primary medications I want appointments for are pain medicines and anxiety medicines.

## 2014-07-15 DIAGNOSIS — Z23 Encounter for immunization: Secondary | ICD-10-CM | POA: Diagnosis not present

## 2014-10-25 DIAGNOSIS — H2513 Age-related nuclear cataract, bilateral: Secondary | ICD-10-CM | POA: Diagnosis not present

## 2014-10-25 DIAGNOSIS — H5203 Hypermetropia, bilateral: Secondary | ICD-10-CM | POA: Diagnosis not present

## 2014-11-07 ENCOUNTER — Encounter: Payer: Medicare Other | Admitting: Internal Medicine

## 2014-11-07 ENCOUNTER — Encounter: Payer: Self-pay | Admitting: Family Medicine

## 2014-11-07 ENCOUNTER — Other Ambulatory Visit: Payer: Self-pay | Admitting: Family Medicine

## 2014-11-07 ENCOUNTER — Ambulatory Visit (INDEPENDENT_AMBULATORY_CARE_PROVIDER_SITE_OTHER): Payer: Medicare Other | Admitting: Family Medicine

## 2014-11-07 VITALS — BP 138/90 | HR 64 | Temp 97.8°F | Ht 69.5 in | Wt 169.0 lb

## 2014-11-07 DIAGNOSIS — Z139 Encounter for screening, unspecified: Secondary | ICD-10-CM

## 2014-11-07 DIAGNOSIS — Z85828 Personal history of other malignant neoplasm of skin: Secondary | ICD-10-CM | POA: Insufficient documentation

## 2014-11-07 DIAGNOSIS — Z0000 Encounter for general adult medical examination without abnormal findings: Secondary | ICD-10-CM

## 2014-11-07 DIAGNOSIS — R351 Nocturia: Secondary | ICD-10-CM

## 2014-11-07 DIAGNOSIS — Z23 Encounter for immunization: Secondary | ICD-10-CM | POA: Diagnosis not present

## 2014-11-07 DIAGNOSIS — N401 Enlarged prostate with lower urinary tract symptoms: Secondary | ICD-10-CM | POA: Diagnosis not present

## 2014-11-07 DIAGNOSIS — I10 Essential (primary) hypertension: Secondary | ICD-10-CM

## 2014-11-07 DIAGNOSIS — E041 Nontoxic single thyroid nodule: Secondary | ICD-10-CM | POA: Diagnosis not present

## 2014-11-07 DIAGNOSIS — Z87891 Personal history of nicotine dependence: Secondary | ICD-10-CM

## 2014-11-07 DIAGNOSIS — E785 Hyperlipidemia, unspecified: Secondary | ICD-10-CM | POA: Diagnosis not present

## 2014-11-07 DIAGNOSIS — IMO0001 Reserved for inherently not codable concepts without codable children: Secondary | ICD-10-CM

## 2014-11-07 LAB — LIPID PANEL
Cholesterol: 144 mg/dL (ref 0–200)
HDL: 48 mg/dL (ref >39.00)
LDL Cholesterol: 86 mg/dL (ref 0–99)
NonHDL: 96
Total CHOL/HDL Ratio: 3
Triglycerides: 48 mg/dL (ref 0.0–149.0)
VLDL: 9.6 mg/dL (ref 0.0–40.0)

## 2014-11-07 LAB — COMPREHENSIVE METABOLIC PANEL
ALT: 16 U/L (ref 0–53)
AST: 25 U/L (ref 0–37)
Albumin: 4.4 g/dL (ref 3.5–5.2)
Alkaline Phosphatase: 47 U/L (ref 39–117)
BUN: 11 mg/dL (ref 6–23)
CO2: 27 mEq/L (ref 19–32)
Calcium: 9.5 mg/dL (ref 8.4–10.5)
Chloride: 104 mEq/L (ref 96–112)
Creatinine, Ser: 0.99 mg/dL (ref 0.40–1.50)
GFR: 80.04 mL/min (ref >60.00)
Glucose, Bld: 102 mg/dL — ABNORMAL HIGH (ref 70–99)
Potassium: 4.3 mEq/L (ref 3.5–5.1)
Sodium: 138 mEq/L (ref 135–145)
Total Bilirubin: 0.7 mg/dL (ref 0.2–1.2)
Total Protein: 7.1 g/dL (ref 6.0–8.3)

## 2014-11-07 LAB — CBC
HCT: 40.7 % (ref 39.0–52.0)
HEMOGLOBIN: 14.1 g/dL (ref 13.0–17.0)
MCHC: 34.5 g/dL (ref 30.0–36.0)
MCV: 91.8 fl (ref 78.0–100.0)
PLATELETS: 181 10*3/uL (ref 150.0–400.0)
RBC: 4.44 Mil/uL (ref 4.22–5.81)
RDW: 13.1 % (ref 11.5–15.5)
WBC: 4.2 10*3/uL (ref 4.0–10.5)

## 2014-11-07 LAB — PSA: PSA: 1.11 ng/mL (ref 0.10–4.00)

## 2014-11-07 LAB — TSH: TSH: 0.56 u[IU]/mL (ref 0.35–4.50)

## 2014-11-07 NOTE — Addendum Note (Signed)
Addended by: Marin Olp on: 11/07/2014 04:52 PM   Modules accepted: Miquel Dunn

## 2014-11-07 NOTE — Assessment & Plan Note (Signed)
Calculate 10 year risk with lipids today. Red yeast rice, fish oil, ASA. consider rx statin

## 2014-11-07 NOTE — Progress Notes (Signed)
Pre visit review using our clinic review tool, if applicable. No additional management support is needed unless otherwise documented below in the visit note. 

## 2014-11-07 NOTE — Assessment & Plan Note (Signed)
multinodular goiter in 2011 with stable repeat 07/2013. TSH today. Repeat thyroid u/s in 1 year if no abnormalities.

## 2014-11-07 NOTE — Assessment & Plan Note (Signed)
Home #s slightly elevated intermittently but average still at goal at 139/87. Discussed adding DASH diet and if remains over 140/90 return to clinic to discuss options for 2nd agent. Continue amlodipine.

## 2014-11-07 NOTE — Progress Notes (Addendum)
Clifford Reddish, MD Phone: [phone]  Subjective:  Patient presents today for their annual wellness visit.    Patient and I discussed his hypertension and mild poor control. Home readings are similar to in office with average at 139/87. Patient has questions about his thyroid nodules previously discovered. Last check Thyroid u/s 07/2013 for multinoduler goiter. He has known HLD but does not want to take statin, he is compliant with red yeast rice, asa, fish oil.   ROS-No chest pain or shortness of breath. No hair or nail changes. No heat/cold intolerance. No constipation or diarrhea. Denies shakiness or anxiety. No palpitations. Does have nocturia 1-2x a night. Mild left knee pain at times without locking/popping/giving way. Hernia left groin noted without worsening pain or enlargement. Occasional popping right ear-improves with blowing nose while plugged.   Left knee pain intermittent, left side hernia, right ear fluid Preventive Screening-Counseling & Management  Smoking Status: former smoker Second Hand Smoking status: No smokers in home  Risk Factors Regular exercise: yes, minimum 5 days a week Diet: healthy  Fall Risk: None   Cardiac risk factors:  advanced age (older than 50 for men, 71 for women)  Hyperlipidemia , yes, mild poor control No diabetes.  Family History: CAD in brother at 5 and father 11. Father smoker, brother poor lifestyle depression   Depression Screen None. PHQ2 0   Activities of Daily Living Independent ADLs and IADLs   Hearing Difficulties: -patient declines  Cognitive Testing No reported trouble.   Normal 3 word recall  List the Names of Other Physician/Practitioners you currently use: 1.Dr. Haverstock of dermatology. March 2016 2. Optho Dr. Ellie Lunch 3. GI Dr. Collene Mares  Immunization History  Administered Date(s) Administered  . Influenza Split 07/14/2012, 06/28/2013  . Influenza-Unspecified 07/17/2014  . Pneumococcal Conjugate-13  11/07/2014  . Pneumococcal Polysaccharide-23 10/06/2012  . Tdap 01/08/2010  . Zoster 01/08/2010   Required Immunizations needed today Prevnar, given  Screening tests- up to date, except AAA screen  ROS- No pertinent positives discovered in course of AWV other than ROS listed above.   The following were reviewed and entered/updated in epic: Past Medical History  Diagnosis Date  . GERD (gastroesophageal reflux disease)   . Hyperlipidemia   . Hypertension   . THYROID NODULE 05/05/2007    Multiple nodules. Biopsy 2007 hyperplastic. No further follow up planned.     Patient Active Problem List   Diagnosis Date Noted  . THYROID NODULE 11/07/2014    Priority: Medium  . Hyperlipidemia 05/05/2007    Priority: Medium  . Essential hypertension 05/05/2007    Priority: Medium  . History of skin cancer 11/07/2014    Priority: Low  . GERD 05/05/2007    Priority: Low   Past Surgical History  Procedure Laterality Date  . Thyroid surgery      biopsy 2007 benign    Family History  Problem Relation Age of Onset  . Heart disease Father 25    smoker  . Heart attack Father 24  . Hypertension Sister   . Heart attack Brother 51    did not eat well, no medical care  . Hypertension Brother   . Diabetes Mother   . Diabetes Sister     Medications- reviewed and updated Current Outpatient Prescriptions  Medication Sig Dispense Refill  . amLODipine (NORVASC) 10 MG tablet Take 1 tablet (10 mg total) by mouth daily. 90 tablet 3  . aspirin 81 MG tablet Take 81 mg by mouth daily.      Marland Kitchen  co-enzyme Q-10 30 MG capsule Take 300 mg by mouth daily.     . fish oil-omega-3 fatty acids 1000 MG capsule Take 2 g by mouth daily.      . Multiple Vitamin (MULTIVITAMIN) tablet Take 1 tablet by mouth 4 (four) times a week.     . Red Yeast Rice Extract (RED YEAST RICE PO) Take 1,200 mg by mouth daily.     No current facility-administered medications for this visit.    Allergies-reviewed and  updated Allergies  Allergen Reactions  . Lisinopril     Mild cough    History   Social History  . Marital Status: Married    Spouse Name: N/A    Number of Children: N/A  . Years of Education: N/A   Social History Main Topics  . Smoking status: Former Smoker -- 1.00 packs/day for 6 years    Types: Cigarettes    Quit date: 10/14/1965  . Smokeless tobacco: Not on file  . Alcohol Use: 0.6 oz/week    1 Not specified per week  . Drug Use: No  . Sexual Activity: Not on file   Other Topics Concern  . Not on file   Social History Narrative   Married (wife patient of Dr. Regis Bill). 2 sons. 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       Hobbies: golf-runs church golf group, go see grandkids. Walk 3 miles alternating days with going to [facility]    Objective: BP 138/90 mmHg  Pulse 64  Temp(Src) 97.8 F (36.6 C) (Oral)  Ht 5' 9.5" (1.765 m)  Wt 169 lb (76.658 kg)  BMI 24.61 kg/m2  SpO2 96% Gen: NAD, resting comfortably HEENT: Mucous membranes are moist. Oropharynx normal Neck: no thyromegaly CV: RRR no murmurs rubs or gallops Lungs: CTAB no crackles, wheeze, rhonchi Abdomen: soft/nontender/nondistended/normal bowel sounds. No rebound or guarding.  GU: Left side very slight asymmetry/enlargement compared to right, overall mild enlargement  Ext: no edema Skin: warm, dry Neuro: grossly normal, moves all extremities, PERRLA  Assessment/Plan:  AWV with need for prevnar, AAA as former smoker  Essential hypertension Home #s slightly elevated intermittently but average still at goal at 139/87. Discussed adding DASH diet and if remains over 140/90 return to clinic to discuss options for 2nd agent. Continue amlodipine.    Hyperlipidemia Calculate 10 year risk with lipids today. Red yeast rice, fish oil, ASA. consider rx  statin   THYROID NODULE multinodular goiter in 2011 with stable repeat 07/2013. TSH today. Repeat thyroid u/s in 1 year if no abnormalities.     Return precautions advised. 1 year follow up if BP improves with home monitoring on DASH, within 3 months otherwise.   Orders Placed This Encounter  Procedures  . US Soft Tissue Head/Neck    169LBS/NO NEEDS/INS/[facility]ARE [facility] [place]/HB/PT W/EPIC    Standing Status: Future     Number of Occurrences:      Standing Expiration Date: 01/06/2016    Order Specific Question:  Reason for Exam (SYMPTOM  OR DIAGNOSIS REQUIRED)    Answer:  thyroid nodules follow up. Last imaging 2007. benign biopsy for dominant nodule    Order Specific Question:  Preferred imaging location?    Answer:  [facility]  . [place] AAA DUPLEX    Standing Status: Future     Number of Occurrences:  Standing Expiration Date: 01/06/2016    Order Specific Question:  Reason for Exam (SYMPTOM  OR DIAGNOSIS REQUIRED)    Answer:  screening for former smoker    Order Specific Question:  Preferred imaging location?    Answer:  [facility]  . Pneumococcal conjugate vaccine 13-valent IM  . CBC    [place]  . Comprehensive metabolic panel    [place]    Order Specific Question:  Has the patient fasted?    Answer:  No  . Lipid panel    [place]    Order Specific Question:  Has the patient fasted?    Answer:  No  . TSH    [place]  . PSA  PSA due to mild enlargement on exam and 1-2x a night nocturia

## 2014-11-07 NOTE — Patient Instructions (Addendum)
Let me know within 3 months if BP remains above 140/90. May need to have you in to discuss adjustments.   Fasting labs today  Ordered thyroid ultrasound as well as AAA screening (abdominal aorta as former smoker). You will be called for both of these.   Right ear likely related to eustachian tube dysfunction    DASH Eating Plan DASH stands for "Dietary Approaches to Stop Hypertension." The DASH eating plan is a healthy eating plan that has been shown to reduce high blood pressure (hypertension). Additional health benefits may include reducing the risk of type 2 diabetes mellitus, heart disease, and stroke. The DASH eating plan may also help with weight loss. WHAT DO I NEED TO KNOW ABOUT THE DASH EATING PLAN? For the DASH eating plan, you will follow these general guidelines:  Choose foods with a percent daily value for sodium of less than 5% (as listed on the food label).  Use salt-free seasonings or herbs instead of table salt or sea salt.  Check with your health care provider or pharmacist before using salt substitutes.  Eat lower-sodium products, often labeled as "lower sodium" or "no salt added."  Eat fresh foods.  Eat more vegetables, fruits, and low-fat dairy products.  Choose whole grains. Look for the word "whole" as the first word in the ingredient list.  Choose fish and skinless chicken or [place] more often than red meat. Limit fish, poultry, and meat to 6 oz (170 g) each day.  Limit sweets, desserts, sugars, and sugary drinks.  Choose heart-healthy fats.  Limit cheese to 1 oz (28 g) per day.  Eat more home-cooked food and less restaurant, buffet, and fast food.  Limit fried foods.  Cook foods using methods other than frying.  Limit canned vegetables. If you do use them, rinse them well to decrease the sodium.  When eating at a restaurant, ask that your food be prepared with less salt, or no salt if possible. WHAT FOODS CAN I EAT? Seek help from a dietitian  for individual calorie needs. Grains Whole grain or whole wheat bread. Brown rice. Whole grain or whole wheat pasta. Quinoa, bulgur, and whole grain cereals. Low-sodium cereals. Corn or whole wheat flour tortillas. Whole grain cornbread. Whole grain crackers. Low-sodium crackers. Vegetables Fresh or frozen vegetables (raw, steamed, roasted, or grilled). Low-sodium or reduced-sodium tomato and vegetable juices. Low-sodium or reduced-sodium tomato sauce and paste. Low-sodium or reduced-sodium canned vegetables.  Fruits All fresh, canned (in natural juice), or frozen fruits. Meat and Other Protein Products Ground beef (85% or leaner), grass-fed beef, or beef trimmed of fat. Skinless chicken or [place]. Ground chicken or [place]. Pork trimmed of fat. All fish and seafood. Eggs. Dried beans, peas, or lentils. Unsalted nuts and seeds. Unsalted canned beans. Dairy Low-fat dairy products, such as skim or 1% milk, 2% or reduced-fat cheeses, low-fat ricotta or cottage cheese, or plain low-fat yogurt. Low-sodium or reduced-sodium cheeses. Fats and Oils Tub margarines without trans fats. Light or reduced-fat mayonnaise and salad dressings (reduced sodium). Avocado. Safflower, olive, or canola oils. Natural peanut or almond butter. Other Unsalted popcorn and pretzels. The items listed above may not be a complete list of recommended foods or beverages. Contact your dietitian for more options. WHAT FOODS ARE NOT RECOMMENDED? Grains White bread. White pasta. White rice. Refined cornbread. Bagels and croissants. Crackers that contain trans fat. Vegetables Creamed or fried vegetables. Vegetables in a cheese sauce. Regular canned vegetables. Regular canned tomato sauce and paste. Regular tomato and vegetable juices.  Fruits Dried fruits. Canned fruit in light or heavy syrup. Fruit juice. Meat and Other Protein Products Fatty cuts of meat. Ribs, chicken wings, bacon, sausage, bologna, salami, chitterlings, fatback,  hot dogs, bratwurst, and packaged luncheon meats. Salted nuts and seeds. Canned beans with salt. Dairy Whole or 2% milk, cream, half-and-half, and cream cheese. Whole-fat or sweetened yogurt. Full-fat cheeses or blue cheese. Nondairy creamers and whipped toppings. Processed cheese, cheese spreads, or cheese curds. Condiments Onion and garlic salt, seasoned salt, table salt, and sea salt. Canned and packaged gravies. Worcestershire sauce. Tartar sauce. Barbecue sauce. Teriyaki sauce. Soy sauce, including reduced sodium. Steak sauce. Fish sauce. Oyster sauce. Cocktail sauce. Horseradish. Ketchup and mustard. Meat flavorings and tenderizers. Bouillon cubes. Hot sauce. Tabasco sauce. Marinades. Taco seasonings. Relishes. Fats and Oils Butter, stick margarine, lard, shortening, ghee, and bacon fat. Coconut, palm kernel, or palm oils. Regular salad dressings. Other Pickles and olives. Salted popcorn and pretzels. The items listed above may not be a complete list of foods and beverages to avoid. Contact your dietitian for more information. WHERE CAN I FIND MORE INFORMATION? National Heart, Lung, and Blood Institute: travelstabloid.com Document Released: 09/19/2011 Document Revised: 02/14/2014 Document Reviewed: 08/04/2013 [facility] [place] [facility] Patient Information 2015 [place], [place]. This information is not intended to replace advice given to you by your health care provider. Make sure you discuss any questions you have with your health care provider.

## 2014-11-09 ENCOUNTER — Other Ambulatory Visit: Payer: Medicare Other

## 2014-11-14 ENCOUNTER — Ambulatory Visit
Admission: RE | Admit: 2014-11-14 | Discharge: 2014-11-14 | Disposition: A | Discharge status: Home / Self Care | Payer: Medicare Other | Source: Ambulatory Visit | Attending: Family Medicine | Admitting: Family Medicine

## 2014-11-14 ENCOUNTER — Encounter: Payer: Self-pay | Admitting: Family Medicine

## 2014-11-14 DIAGNOSIS — Z139 Encounter for screening, unspecified: Secondary | ICD-10-CM

## 2014-11-14 DIAGNOSIS — Z87891 Personal history of nicotine dependence: Secondary | ICD-10-CM | POA: Insufficient documentation

## 2014-11-14 DIAGNOSIS — Z136 Encounter for screening for cardiovascular disorders: Secondary | ICD-10-CM | POA: Diagnosis not present

## 2014-11-22 DIAGNOSIS — Z85828 Personal history of other malignant neoplasm of skin: Secondary | ICD-10-CM | POA: Diagnosis not present

## 2014-11-22 DIAGNOSIS — L821 Other seborrheic keratosis: Secondary | ICD-10-CM | POA: Diagnosis not present

## 2014-11-22 DIAGNOSIS — D1800 Hemangioma unspecified site: Secondary | ICD-10-CM | POA: Diagnosis not present

## 2014-11-22 DIAGNOSIS — L814 Other melanin hyperpigmentation: Secondary | ICD-10-CM | POA: Diagnosis not present

## 2014-12-21 ENCOUNTER — Other Ambulatory Visit: Payer: Self-pay | Admitting: *Deleted

## 2014-12-21 MED ORDER — AMLODIPINE BESYLATE 10 MG PO TABS: 10.0000 mg | ORAL_TABLET | Freq: Every day | ORAL | Status: DC

## 2015-07-07 DIAGNOSIS — Z23 Encounter for immunization: Secondary | ICD-10-CM | POA: Diagnosis not present

## 2015-11-08 ENCOUNTER — Encounter: Payer: Self-pay | Admitting: Family Medicine

## 2015-11-13 ENCOUNTER — Encounter: Payer: Medicare Other | Admitting: Family Medicine

## 2015-11-15 ENCOUNTER — Encounter: Payer: Self-pay | Admitting: Family Medicine

## 2015-11-15 ENCOUNTER — Ambulatory Visit (INDEPENDENT_AMBULATORY_CARE_PROVIDER_SITE_OTHER): Payer: Medicare Other | Admitting: Family Medicine

## 2015-11-15 VITALS — BP 132/80 | HR 60 | Temp 97.9°F | Ht 69.0 in | Wt 171.0 lb

## 2015-11-15 DIAGNOSIS — I10 Essential (primary) hypertension: Secondary | ICD-10-CM | POA: Diagnosis not present

## 2015-11-15 DIAGNOSIS — E785 Hyperlipidemia, unspecified: Secondary | ICD-10-CM

## 2015-11-15 DIAGNOSIS — E041 Nontoxic single thyroid nodule: Secondary | ICD-10-CM | POA: Diagnosis not present

## 2015-11-15 DIAGNOSIS — Z87891 Personal history of nicotine dependence: Secondary | ICD-10-CM

## 2015-11-15 DIAGNOSIS — Z0000 Encounter for general adult medical examination without abnormal findings: Secondary | ICD-10-CM | POA: Diagnosis not present

## 2015-11-15 LAB — CBC
HEMATOCRIT: 43 % (ref 39.0–52.0)
Hemoglobin: 14.5 g/dL (ref 13.0–17.0)
MCHC: 33.7 g/dL (ref 30.0–36.0)
MCV: 94.2 fl (ref 78.0–100.0)
Platelets: 197 10*3/uL (ref 150.0–400.0)
RBC: 4.56 Mil/uL (ref 4.22–5.81)
RDW: 13 % (ref 11.5–15.5)
WBC: 4.8 10*3/uL (ref 4.0–10.5)

## 2015-11-15 LAB — LIPID PANEL
CHOLESTEROL: 153 mg/dL (ref 0–200)
HDL: 43 mg/dL (ref >39.00)
LDL Cholesterol: 98 mg/dL (ref 0–99)
NONHDL: 110.3
Total CHOL/HDL Ratio: 4
Triglycerides: 63 mg/dL (ref 0.0–149.0)
VLDL: 12.6 mg/dL (ref 0.0–40.0)

## 2015-11-15 LAB — COMPREHENSIVE METABOLIC PANEL
ALBUMIN: 4.6 g/dL (ref 3.5–5.2)
ALK PHOS: 47 U/L (ref 39–117)
ALT: 15 U/L (ref 0–53)
AST: 23 U/L (ref 0–37)
BILIRUBIN TOTAL: 0.8 mg/dL (ref 0.2–1.2)
BUN: 15 mg/dL (ref 6–23)
CO2: 27 mEq/L (ref 19–32)
CREATININE: 1.06 mg/dL (ref 0.40–1.50)
Calcium: 9.8 mg/dL (ref 8.4–10.5)
Chloride: 105 mEq/L (ref 96–112)
GFR: 73.74 mL/min (ref >60.00)
Glucose, Bld: 113 mg/dL — ABNORMAL HIGH (ref 70–99)
Potassium: 4.6 mEq/L (ref 3.5–5.1)
SODIUM: 140 meq/L (ref 135–145)
TOTAL PROTEIN: 7.4 g/dL (ref 6.0–8.3)

## 2015-11-15 LAB — POCT URINALYSIS DIPSTICK
Bilirubin, UA: NEGATIVE
Blood, UA: NEGATIVE
Glucose, UA: NEGATIVE
Ketones, UA: NEGATIVE
LEUKOCYTES UA: NEGATIVE
Nitrite, UA: NEGATIVE
PH UA: 7
PROTEIN UA: NEGATIVE
Spec Grav, UA: 1.015
UROBILINOGEN UA: 0.2

## 2015-11-15 LAB — TSH: TSH: 0.53 u[IU]/mL (ref 0.35–4.50)

## 2015-11-15 NOTE — Progress Notes (Addendum)
Clifford Reddish, MD Phone: [phone]  Subjective:  Patient presents today for their annual wellness visit.    Preventive Screening-Counseling & Management  Smoking Status: former Smoker.  Quit 1967. 6 pack years Second Hand Smoking status: No smokers in home  Risk Factors Regular exercise: yes, between walking and [employer] (alternates)  Diet: reasonable, maintaining normal weight   Fall Risk: None   Cardiac risk factors:  advanced age (older than 40 for men, 54 for women)  Hyperlipidemia , yes but controlled No diabetes.  Hypertension well controlled Family History: father with heart attack at age 19 but was smoker, brother had heart attack at 69 but did not get regular  Medical care   Depression Screen None. PHQ2 0   Activities of Daily Living Independent ADLs and IADLs   Hearing Difficulties: -patient declines  Cognitive Testing No reported trouble.   Normal 3 word recall  List the Names of Other Physician/Practitioners you currently use: -Dr. Renda Rolls of dermatology -also sees optho every 2 years  Immunization History  Administered Date(s) Administered  . Influenza Split 07/14/2012, 06/28/2013  . Influenza-Unspecified 07/17/2014  . Pneumococcal Conjugate-13 11/07/2014  . Pneumococcal Polysaccharide-23 10/06/2012  . Tdap 01/08/2010  . Zoster 01/08/2010   Required Immunizations needed today none, had flu in september  Screening tests- up to date Health Maintenance Due  Topic Date Due  . Hepatitis C Screening - [facility] so already screened [birth year]   ROS- No pertinent positives discovered in course of AWV Pertinent problem oriented: No chest pain or shortness of breath. No headache or blurry vision. Rare dizziness is noted.  No hair or nail changes. No heat/cold intolerance change. No constipation or diarrhea. Denies shakiness or anxiety.   The following were reviewed and entered/updated in epic: Past Medical  History  Diagnosis Date  . GERD (gastroesophageal reflux disease)   . Hyperlipidemia   . Hypertension   . THYROID NODULE 05/05/2007    Multiple nodules. Biopsy 2007 hyperplastic. No further follow up planned.     Patient Active Problem List   Diagnosis Date Noted  . THYROID NODULE 11/07/2014    Priority: Medium  . Hyperlipidemia 05/05/2007    Priority: Medium  . Essential hypertension 05/05/2007    Priority: Medium  . Former smoker 11/14/2014    Priority: Low  . History of skin cancer 11/07/2014    Priority: Low  . GERD 05/05/2007    Priority: Low   Past Surgical History  Procedure Laterality Date  . Thyroid surgery      biopsy 2007 benign    Family History  Problem Relation Age of Onset  . Heart disease Father 72    smoker  . Hypertension Sister   . Heart attack Brother 51    did not eat well, no medical care  . Hypertension Brother   . Diabetes Mother   . Diabetes Sister    Medications- reviewed and updated Current Outpatient Prescriptions  Medication Sig Dispense Refill  . amLODipine (NORVASC) 10 MG tablet Take 1 tablet (10 mg total) by mouth daily. 90 tablet 3  . aspirin 81 MG tablet Take 81 mg by mouth daily.      Marland Kitchen co-enzyme Q-10 30 MG capsule Take 300 mg by mouth daily.     . fish oil-omega-3 fatty acids 1000 MG capsule Take 2 g by mouth daily.      . Multiple Vitamin (MULTIVITAMIN) tablet Take 1 tablet by mouth 3 (four) times a week.     Marland Kitchen  Red Yeast Rice Extract (RED YEAST RICE PO) Take 1,200 mg by mouth daily.      Allergies-reviewed and updated Allergies  Allergen Reactions  . Lisinopril     Mild cough    Social History   Social History  . Marital Status: Married    Spouse Name: N/A  . Number of Children: N/A  . Years of Education: N/A   Social History Main Topics  . Smoking status: Former Smoker -- 1.00 packs/day for 6 years    Types: Cigarettes    Quit date: 10/14/1965  . Smokeless tobacco: None  . Alcohol Use: 0.6 oz/week    1  Standard drinks or equivalent per week  . Drug Use: No  . Sexual Activity: Not Asked   Other Topics Concern  . None   Social History Narrative   Married (wife patient of Dr. Regis Bill). 2 sons. 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       Hobbies: golf-runs church golf group, go see grandkids. Walk 3 miles alternating days with going to [facility]    Objective: BP 132/80 mmHg  Pulse 60  Temp(Src) 97.9 F (36.6 C)  Ht 5\' 9"  (1.753 m)  Wt 171 lb (77.565 kg)  BMI 25.24 kg/m2  Gen: NAD, resting comfortably HEENT: Mucous membranes are moist. Oropharynx normal Neck: thyromegaly with multiple small nodules noted CV: RRR no murmurs rubs or gallops Lungs: CTAB no crackles, wheeze, rhonchi Abdomen: soft/nontender/nondistended/normal bowel sounds. No rebound or guarding.  Ext: no edema, 2+ DP pulses Skin: warm, dry Neuro: grossly normal, moves all extremities, PERRLA  Assessment/Plan:  AWV completed- see above  Problem oriented visit also completed  THYROID NODULE S: multinodular goiter in 2011 and stable on 2014 repeat. TSH WNL Lab Results  Component Value Date   TSH 0.56 11/07/2014  A/P: will update TSH and update ultrasound- with plan for every 3 year ultrasound unless changes   Hyperlipidemia S: reasonably controlled on red yeast rice. No myalgias.  Lab Results  Component Value Date   CHOL 144 11/07/2014   HDL 48.00 11/07/2014   LDLCALC 86 11/07/2014   TRIG 48.0 11/07/2014   CHOLHDL 3 11/07/2014   A/P: repeat lipids at this time. Continue aspirin, fish oil, red yeast rice likely    Essential hypertension S: mild poor control in past on amlodipine 10mg  alone. Brings home log where che checks weekl and in 2016 was 133/82 on average and 133/81 so far this year BP Readings from Last 3 Encounters:  11/15/15 132/80    11/07/14 138/90  10/29/13 148/85  A/P:Continue current meds:  Home readings excellent and consistent with in office readings   Return in about 1 year (around 11/14/2016) for annual wellness visit. Return precautions advised.   Orders Placed This Encounter  Procedures  . US Soft Tissue Head/Neck    Standing Status: Future     Number of Occurrences:      Standing Expiration Date: 01/12/2017    Scheduling Instructions:     May change to whichever location completes these. Was not sure if [employer] street did    Order Specific Question:  Reason for Exam (SYMPTOM  OR DIAGNOSIS REQUIRED)    Answer:  follow up multinodular goiter    Order Specific Question:  Preferred imaging location?    Answer:  [facility]  . TSH  [place]  . CBC    [place]  . Comprehensive metabolic panel    [place]    Order Specific Question:  Has the patient fasted?    Answer:  No  . Lipid panel    [place]    Order Specific Question:  Has the patient fasted?    Answer:  No  . POCT urinalysis dipstick    In house

## 2015-11-15 NOTE — Assessment & Plan Note (Signed)
S: reasonably controlled on red yeast rice. No myalgias.  Lab Results  Component Value Date   CHOL 144 11/07/2014   HDL 48.00 11/07/2014   LDLCALC 86 11/07/2014   TRIG 48.0 11/07/2014   CHOLHDL 3 11/07/2014   A/P: repeat lipids at this time. Continue aspirin, fish oil, red yeast rice likely

## 2015-11-15 NOTE — Patient Instructions (Addendum)
We will call you within a week about your updated ultrasound. If you do not hear within 2 weeks, give [place] a call.   Blood pressure has looked great! Continue to monitor and goal <140/90.   Great job with exercise  Clifford Houston , Thank you for taking time to come for your Medicare Wellness Visit. I appreciate your ongoing commitment to your health goals. Please review the following plan we discussed and let me know if I can assist you in the future.   These are the goals we discussed: 1. Continue great job with exercise   This is a list of the screening recommended for you and due dates:  Health Maintenance  Topic Date Due  . Flu Shot  05/14/2016  . Tetanus Vaccine  01/09/2020  . Colon Cancer Screening  03/13/2023  . Shingles Vaccine  Completed  .  Hepatitis C: One time screening is recommended by Center for Disease Control  (CDC) for  adults born from [birth year] through 1964.   Addressed  . Pneumonia vaccines  Completed

## 2015-11-15 NOTE — Assessment & Plan Note (Addendum)
S: mild poor control in past on amlodipine 10mg  alone. Brings home log where che checks weekl and in 2016 was 133/82 on average and 133/81 so far this year BP Readings from Last 3 Encounters:  11/15/15 132/80  11/07/14 138/90  10/29/13 148/85  A/P:Continue current meds:  Home readings excellent and consistent with in office readings

## 2015-11-15 NOTE — Assessment & Plan Note (Signed)
S: multinodular goiter in 2011 and stable on 2014 repeat. TSH WNL Lab Results  Component Value Date   TSH 0.56 11/07/2014  A/P: will update TSH and update ultrasound- with plan for every 3 year ultrasound unless changes

## 2015-11-20 ENCOUNTER — Ambulatory Visit
Admission: RE | Admit: 2015-11-20 | Discharge: 2015-11-20 | Disposition: A | Discharge status: Home / Self Care | Payer: Medicare Other | Source: Ambulatory Visit | Attending: Family Medicine | Admitting: Family Medicine

## 2015-11-20 DIAGNOSIS — E041 Nontoxic single thyroid nodule: Secondary | ICD-10-CM

## 2015-11-20 DIAGNOSIS — E042 Nontoxic multinodular goiter: Secondary | ICD-10-CM | POA: Diagnosis not present

## 2015-11-23 ENCOUNTER — Other Ambulatory Visit: Payer: Self-pay | Admitting: *Deleted

## 2015-11-23 DIAGNOSIS — E041 Nontoxic single thyroid nodule: Secondary | ICD-10-CM

## 2015-11-27 ENCOUNTER — Encounter: Payer: Self-pay | Admitting: Family Medicine

## 2015-12-13 ENCOUNTER — Ambulatory Visit (INDEPENDENT_AMBULATORY_CARE_PROVIDER_SITE_OTHER): Payer: Medicare Other | Admitting: Endocrinology

## 2015-12-13 ENCOUNTER — Encounter: Payer: Self-pay | Admitting: Endocrinology

## 2015-12-13 VITALS — BP 122/84 | HR 64 | Temp 97.8°F | Ht 69.0 in | Wt 169.0 lb

## 2015-12-13 DIAGNOSIS — E041 Nontoxic single thyroid nodule: Secondary | ICD-10-CM | POA: Diagnosis not present

## 2015-12-13 NOTE — Patient Instructions (Signed)

## 2015-12-13 NOTE — Progress Notes (Signed)
Subjective:    Patient ID: Clifford Houston, male    DOB: [birth year], 70 y.o.   MRN: [id number]  HPI Pt was noted to have a nodule at the thyroid in 2007.  He had bx then.  He has no h/o XRT or surgery to the neck.  He has slight deepening of the voice, but no assoc neck swelling.   Past Medical History  Diagnosis Date  . GERD (gastroesophageal reflux disease)   . Hyperlipidemia   . Hypertension   . THYROID NODULE 05/05/2007    Multiple nodules. Biopsy 2007 hyperplastic. No further follow up planned.      Past Surgical History  Procedure Laterality Date  . Thyroid surgery      biopsy 2007 benign    Social History   Social History  . Marital Status: Married    Spouse Name: N/A  . Number of Children: N/A  . Years of Education: N/A   Occupational History  . Not on file.   Social History Main Topics  . Smoking status: Former Smoker -- 1.00 packs/day for 6 years    Types: Cigarettes    Quit date: 10/14/1965  . Smokeless tobacco: Not on file  . Alcohol Use: 0.6 oz/week    1 Standard drinks or equivalent per week  . Drug Use: No  . Sexual Activity: Not on file   Other Topics Concern  . Not on file   Social History Narrative   Married (wife patient of Dr. Regis Bill). 2 sons. 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       Hobbies: golf-runs church golf group, go see grandkids. Walk 3 miles alternating days with going to [facility]    Current Outpatient Prescriptions on File Prior to Visit  Medication Sig Dispense Refill  . amLODipine (NORVASC) 10 MG tablet Take 1 tablet (10 mg total) by mouth daily. 90 tablet 3  . aspirin 81 MG tablet Take 81 mg by mouth daily.      Marland Kitchen co-enzyme Q-10 30 MG capsule Take 300 mg by mouth daily.     . fish oil-omega-3 fatty acids 1000 MG capsule Take 2 g by mouth daily.      . Multiple  Vitamin (MULTIVITAMIN) tablet Take 1 tablet by mouth 4 (four) times a week.     . Red Yeast Rice Extract (RED YEAST RICE PO) Take 1,200 mg by mouth daily.     No current facility-administered medications on file prior to visit.    Allergies  Allergen Reactions  . Lisinopril     Mild cough    Family History  Problem Relation Age of Onset  . Heart disease Father 23    smoker  . Hypertension Sister   . Heart attack Brother 51    did not eat well, no medical care  . Hypertension Brother   . Diabetes Mother   . Diabetes Sister   . Thyroid disease Father     goiter  . Thyroid disease Sister     I-131 rx    BP 122/84 mmHg  Pulse 64  Temp(Src) 97.8 F (36.6 C) (Oral)  Ht 5\' 9"  (1.753 m)  Wt 169 lb (76.658 kg)  BMI 24.95 kg/m2  SpO2 98%   Review of Systems Denies weight change, hoarseness, neck pain, visual loss, chest pain, sob, cough, dysphagia, skin rash,  easy bruising, anxiety, heat intolerance, headache, numbness, and rhinorrhea.      Objective:   Physical Exam VS: see vs page GEN: no distress HEAD: head: no deformity eyes: no periorbital swelling, no proptosis external nose and ears are normal mouth: no lesion seen NECK: i can feel the top of a RLP thyroid nodule, but i can't tell details CHEST WALL: no deformity LUNGS: clear to auscultation BREASTS:  No gynecomastia CV: reg rate and rhythm, no murmur ABD: abdomen is soft, nontender.  no hepatosplenomegaly.  not distended.  no hernia. MUSCULOSKELETAL: muscle bulk and strength are grossly normal.  no obvious joint swelling.  gait is normal and steady EXTEMITIES: no deformity.  no edema PULSES: no carotid bruit NEURO:  cn 2-12 grossly intact.   readily moves all 4's.  sensation is intact to touch on al 4's.  No tremor SKIN:  Normal texture and temperature.  No rash or suspicious lesion is visible.   NODES:  None palpable at the neck PSYCH: alert, well-oriented.  Does not appear anxious nor depressed.   cytol  (2007): hyperplasticfollicular lesion  [place] (0000000): Bilateral thyroid nodules. There is been slight increase in size on the right, since 2014.  Lab Results  Component Value Date   TSH 0.53 11/15/2015  TSH( 2013)=0.24  I have reviewed outside records, and summarized: Pt was noted to have goiter, and referred here.     Assessment & Plan:  Multinodular goiter, new to me, usually hereditary Hyperthyroidism, mild and intermittent.  However, with time, this will become persistent, then worsen. Deepening of the voice.  Very unlikely thyroid-related.  Patient is advised the following: Patient Instructions  let's check a thyroid "scan" (a special, but easy and painless type of thyroid x ray).  It works like this: you go to the x-ray department of the hospital to swallow a pill, which contains a miniscule amount of radiation.  You will not notice any symptoms from this.  You will go back to the x-ray department the next day, to lie down in front of a camera.  The results of this will be sent to me.   Based on the results, i hope to order for you a treatment pill of radioactive iodine.  Although it is a larger amount of radiation, you will again notice no symptoms from this.  The pill is gone from your body in a few days (during which you should stay away from other people), but takes several months to work.  Therefore, please return here approximately 6-8 weeks after the treatment.  This treatment has been available for many years, and the only known side-effect is an underactive thyroid.  It is possible that i would eventually prescribe for you a thyroid hormone pill, which is very inexpensive.  You don't have to worry about side-effects of this thyroid hormone pill, because it is the same molecule your thyroid makes.

## 2015-12-20 ENCOUNTER — Telehealth: Payer: Self-pay | Admitting: Endocrinology

## 2015-12-20 DIAGNOSIS — L821 Other seborrheic keratosis: Secondary | ICD-10-CM | POA: Diagnosis not present

## 2015-12-20 DIAGNOSIS — Z85828 Personal history of other malignant neoplasm of skin: Secondary | ICD-10-CM | POA: Diagnosis not present

## 2015-12-20 DIAGNOSIS — L570 Actinic keratosis: Secondary | ICD-10-CM | POA: Diagnosis not present

## 2015-12-20 NOTE — Telephone Encounter (Signed)
I contacted the pt and advised [facility] has been contacted and will be in touch to schedule his appointment.

## 2015-12-20 NOTE — Telephone Encounter (Signed)
Pt is concerned because he hasn't been contacted about his Thyroid scan

## 2015-12-27 ENCOUNTER — Ambulatory Visit (HOSPITAL_COMMUNITY)
Admission: RE | Admit: 2015-12-27 | Discharge: 2015-12-27 | Disposition: A | Discharge status: Home / Self Care | Payer: Medicare Other | Source: Ambulatory Visit | Attending: Endocrinology | Admitting: Endocrinology

## 2015-12-27 ENCOUNTER — Encounter: Payer: Self-pay | Admitting: Family Medicine

## 2015-12-27 DIAGNOSIS — E041 Nontoxic single thyroid nodule: Secondary | ICD-10-CM

## 2015-12-27 MED ORDER — SODIUM IODIDE I 131 CAPSULE
13.1600 | Freq: Once | INTRAVENOUS | Status: AC | PRN
  Administered 2015-12-27: 13.16 via ORAL

## 2015-12-28 ENCOUNTER — Encounter (HOSPITAL_COMMUNITY)
Admission: RE | Admit: 2015-12-28 | Discharge: 2015-12-28 | Disposition: A | Discharge status: Home / Self Care | Payer: Medicare Other | Source: Ambulatory Visit | Attending: Endocrinology | Admitting: Endocrinology

## 2015-12-28 DIAGNOSIS — E041 Nontoxic single thyroid nodule: Secondary | ICD-10-CM | POA: Diagnosis not present

## 2015-12-28 DIAGNOSIS — E049 Nontoxic goiter, unspecified: Secondary | ICD-10-CM | POA: Diagnosis not present

## 2015-12-28 MED ORDER — SODIUM PERTECHNETATE TC 99M INJECTION
10.7000 | Freq: Once | INTRAVENOUS | Status: AC | PRN
  Administered 2015-12-28: 11 via INTRAVENOUS

## 2015-12-28 MED ORDER — SODIUM PERTECHNETATE TC 99M INJECTION: 10.7000 | Freq: Once | INTRAVENOUS | Status: DC | PRN

## 2015-12-29 ENCOUNTER — Other Ambulatory Visit: Payer: Self-pay | Admitting: Endocrinology

## 2015-12-29 ENCOUNTER — Encounter: Payer: Self-pay | Admitting: Endocrinology

## 2015-12-29 DIAGNOSIS — E0590 Thyrotoxicosis, unspecified without thyrotoxic crisis or storm: Secondary | ICD-10-CM

## 2016-01-17 ENCOUNTER — Encounter: Payer: Self-pay | Admitting: Endocrinology

## 2016-02-21 ENCOUNTER — Other Ambulatory Visit: Payer: Self-pay | Admitting: Family Medicine

## 2016-07-03 ENCOUNTER — Encounter: Payer: Self-pay | Admitting: Family Medicine

## 2016-07-31 DIAGNOSIS — Z23 Encounter for immunization: Secondary | ICD-10-CM | POA: Diagnosis not present

## 2016-08-19 DIAGNOSIS — H2513 Age-related nuclear cataract, bilateral: Secondary | ICD-10-CM | POA: Diagnosis not present

## 2016-08-19 DIAGNOSIS — H5203 Hypermetropia, bilateral: Secondary | ICD-10-CM | POA: Diagnosis not present

## 2016-11-19 ENCOUNTER — Ambulatory Visit (INDEPENDENT_AMBULATORY_CARE_PROVIDER_SITE_OTHER): Payer: Medicare Other | Admitting: Family Medicine

## 2016-11-19 ENCOUNTER — Encounter: Payer: Self-pay | Admitting: Family Medicine

## 2016-11-19 VITALS — BP 140/92 | HR 71 | Temp 97.9°F | Ht 70.0 in | Wt 169.6 lb

## 2016-11-19 DIAGNOSIS — I10 Essential (primary) hypertension: Secondary | ICD-10-CM

## 2016-11-19 DIAGNOSIS — E0590 Thyrotoxicosis, unspecified without thyrotoxic crisis or storm: Secondary | ICD-10-CM | POA: Diagnosis not present

## 2016-11-19 DIAGNOSIS — Z0000 Encounter for general adult medical examination without abnormal findings: Secondary | ICD-10-CM

## 2016-11-19 DIAGNOSIS — E785 Hyperlipidemia, unspecified: Secondary | ICD-10-CM

## 2016-11-19 DIAGNOSIS — R739 Hyperglycemia, unspecified: Secondary | ICD-10-CM

## 2016-11-19 DIAGNOSIS — Z125 Encounter for screening for malignant neoplasm of prostate: Secondary | ICD-10-CM

## 2016-11-19 LAB — COMPREHENSIVE METABOLIC PANEL
ALK PHOS: 48 U/L (ref 39–117)
ALT: 18 U/L (ref 0–53)
AST: 23 U/L (ref 0–37)
Albumin: 4.8 g/dL (ref 3.5–5.2)
BUN: 15 mg/dL (ref 6–23)
CO2: 28 mEq/L (ref 19–32)
CREATININE: 0.94 mg/dL (ref 0.40–1.50)
Calcium: 9.6 mg/dL (ref 8.4–10.5)
Chloride: 104 mEq/L (ref 96–112)
GFR: 84.46 mL/min (ref >60.00)
GLUCOSE: 111 mg/dL — AB (ref 70–99)
POTASSIUM: 4 meq/L (ref 3.5–5.1)
SODIUM: 139 meq/L (ref 135–145)
Total Bilirubin: 0.8 mg/dL (ref 0.2–1.2)
Total Protein: 7.6 g/dL (ref 6.0–8.3)

## 2016-11-19 LAB — CBC
HCT: 42 % (ref 39.0–52.0)
Hemoglobin: 14.6 g/dL (ref 13.0–17.0)
MCHC: 34.7 g/dL (ref 30.0–36.0)
MCV: 93.1 fl (ref 78.0–100.0)
Platelets: 203 10*3/uL (ref 150.0–400.0)
RBC: 4.51 Mil/uL (ref 4.22–5.81)
RDW: 12.9 % (ref 11.5–15.5)
WBC: 4.2 10*3/uL (ref 4.0–10.5)

## 2016-11-19 LAB — LIPID PANEL
CHOLESTEROL: 173 mg/dL (ref 0–200)
HDL: 51.5 mg/dL (ref >39.00)
LDL CALC: 109 mg/dL — AB (ref 0–99)
NONHDL: 121.98
Total CHOL/HDL Ratio: 3
Triglycerides: 64 mg/dL (ref 0.0–149.0)
VLDL: 12.8 mg/dL (ref 0.0–40.0)

## 2016-11-19 LAB — TSH: TSH: 0.71 u[IU]/mL (ref 0.35–4.50)

## 2016-11-19 LAB — T4, FREE: Free T4: 0.91 ng/dL (ref 0.60–1.60)

## 2016-11-19 LAB — HEMOGLOBIN A1C: HEMOGLOBIN A1C: 5.6 % (ref 4.6–6.5)

## 2016-11-19 LAB — T3, FREE: T3 FREE: 3.5 pg/mL (ref 2.3–4.2)

## 2016-11-19 NOTE — Patient Instructions (Signed)
  Clifford Houston , Thank you for taking time to come for your Medicare Wellness Visit. I appreciate your ongoing commitment to your health goals. Please review the following plan we discussed and let me know if I can assist you in the future.   These are the goals we discussed: 1. Get vitamin D and PSA locally   This is a list of the screening recommended for you and due dates:  Health Maintenance  Topic Date Due  . Tetanus Vaccine  01/09/2020  . Colon Cancer Screening  03/13/2023  . Flu Shot  Addressed  . Shingles Vaccine  Completed  .  Hepatitis C: One time screening is recommended by Center for Disease Control  (CDC) for  adults born from [birth year] through 1964.   Addressed  . Pneumonia vaccines  Completed

## 2016-11-19 NOTE — Progress Notes (Signed)
Pre visit review using our clinic review tool, if applicable. No additional management support is needed unless otherwise documented below in the visit note. 

## 2016-11-19 NOTE — Assessment & Plan Note (Signed)
S: reasonable weight and exercise. Despite this cbg last year 113.  A/P: get a1c. He has read some potential benefits for thyroid on metformin and I agreed if a1c 5.7 or above to go ahead and treat with metformin 500mg  daily.

## 2016-11-19 NOTE — Progress Notes (Signed)
Phone: [phone]  Subjective:  Patient presents today for their annual wellness visit.    Preventive Screening-Counseling & Management  Smoking Status: former Smoker quit 1967. 6 pack years.  Second Hand Smoking status: No smokers in home  Risk Factors Regular exercise: walking. Holding off on [employer] due to flu in community.  Diet: reasonable, maintaining weight  Fall Risk: None  Fall Risk  11/19/2016 11/15/2015 11/07/2014 10/29/2013 10/06/2012  Falls in the past year? No No No No No    Cardiac risk factors:  advanced age (older than 4 for men, 97 for women)  Hyperlipidemia yes but LDL under 100 on red yeast rice Lab Results  Component Value Date   CHOL 153 11/15/2015   HDL 43.00 11/15/2015   LDLCALC 98 11/15/2015   TRIG 63.0 11/15/2015   CHOLHDL 4 11/15/2015  No diabetes. , hyperglycemia will monitor HTN- controlled in past- see section below.  Family History: father with MI 41 but was smoker, brother 31 but no regular medical care   Depression Screen None. PHQ2 0  Depression screen [facility] 2/9 11/19/2016 11/15/2015 11/07/2014 10/29/2013 10/06/2012  Decreased Interest 0 0 0 0 0  Down, Depressed, Hopeless 0 0 0 0 0  PHQ - 2 Score 0 0 0 0 0    Activities of Daily Living Independent ADLs and IADLs   Hearing Difficulties: -patient declines  Cognitive Testing No reported trouble.   Normal 3 word recall  List the Names of Other Physician/Practitioners you currently use: -Dr. Renda Rolls of dermatology -sees optho yearly  Immunization History  Administered Date(s) Administered  . Influenza Split 07/14/2012, 06/28/2013  . Influenza-Unspecified 07/17/2014, 06/15/2015, 07/31/2016  . Pneumococcal Conjugate-13 11/07/2014  . Pneumococcal Polysaccharide-23 10/06/2012  . Tdap 01/08/2010  . Zoster 01/08/2010   Required Immunizations needed today : none  Screening tests- up to date  Colonoscopy 2014 with 10 year repeat  ROS- No pertinent positives discovered in  course of AWV ROS pertinent- no fever, chills. No chest pain or shortness of breath. No headache or blurry vision. No unintentional weight gain or leoss. No chang ein temperature  The following were reviewed and entered/updated in epic: Past Medical History:  Diagnosis Date  . GERD (gastroesophageal reflux disease)   . Hyperlipidemia   . Hypertension   . THYROID NODULE 05/05/2007   Multiple nodules. Biopsy 2007 hyperplastic. No further follow up planned.     Patient Active Problem List   Diagnosis Date Noted  . Hyperthyroidism 12/29/2015    Priority: High  . Hyperglycemia 11/19/2016    Priority: Medium  . THYROID NODULE 11/07/2014    Priority: Medium  . Hyperlipidemia 05/05/2007    Priority: Medium  . Essential hypertension 05/05/2007    Priority: Medium  . Former smoker 11/14/2014    Priority: Low  . History of skin cancer 11/07/2014    Priority: Low  . GERD 05/05/2007    Priority: Low   Past Surgical History:  Procedure Laterality Date  . THYROID SURGERY     biopsy 2007 benign    Family History  Problem Relation Age of Onset  . Heart disease Father 29    smoker  . Hypertension Sister   . Heart attack Brother 51    did not eat well, no medical care  . Hypertension Brother   . Diabetes Mother   . Diabetes Sister   . Thyroid disease Father     goiter  . Thyroid disease Sister     I-131 rx    Medications- reviewed  and updated Current Outpatient Prescriptions  Medication Sig Dispense Refill  . amLODipine (NORVASC) 10 MG tablet TAKE 1 TABLET EVERY DAY 90 tablet 3  . aspirin 81 MG tablet Take 81 mg by mouth daily.      Marland Kitchen co-enzyme Q-10 30 MG capsule Take 300 mg by mouth daily.     . fish oil-omega-3 fatty acids 1000 MG capsule Take 2 g by mouth daily.      . Multiple Vitamin (MULTIVITAMIN) tablet Take 1 tablet by mouth 4 (four) times a week.     . Red Yeast Rice Extract (RED YEAST RICE PO) Take 1,200 mg by mouth daily.     No current facility-administered  medications for this visit.     Allergies-reviewed and updated Allergies  Allergen Reactions  . Lisinopril     Mild cough    Social History   Social History  . Marital status: Married    Spouse name: N/A  . Number of children: N/A  . Years of education: N/A   Social History Main Topics  . Smoking status: Former Smoker    Packs/day: 1.00    Years: 6.00    Types: Cigarettes    Quit date: 10/14/1965  . Smokeless tobacco: Never Used  . Alcohol use 0.6 oz/week    1 Standard drinks or equivalent per week  . Drug use: No  . Sexual activity: Not Asked   Other Topics Concern  . None   Social History Narrative   Married (wife patient of Dr. Regis Bill). 2 sons. 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       Hobbies: golf-runs church golf group, go see grandkids. Walk 3 miles alternating days with going to [facility]    Objective: BP (!) 140/92 (BP Location: Left Arm, Patient Position: Sitting, Cuff Size: Large)   Pulse 71   Temp 97.9 F (36.6 C) (Oral)   Ht 5\' 10"  (1.778 m)   Wt 169 lb 9.6 oz (76.9 kg)   SpO2 99%   BMI 24.34 kg/m  Gen: NAD, resting comfortably HEENT: Mucous membranes are moist. Oropharynx normal Neck: stable thyromegaly CV: RRR no murmurs rubs or gallops Lungs: CTAB no crackles, wheeze, rhonchi Abdomen: soft/nontender/nondistended/normal bowel sounds. No rebound or guarding.  Ext: no edema Skin: warm, dry Neuro: grossly normal, moves all extremities, PERRLA Rectal: normal tone, normal sized  prostate, no masses or tenderness  Assessment/Plan:  AWV completed- discussed recommended screenings anddocumented any personalized health advice and referrals for preventive counseling. See AVS as well which was given to patient.   Status of chronic or acute concerns   Requests prostate cancer screening-  rectal low risk . Update PSA locally due to medicare not covering Lab Results  Component Value Date   PSA 1.11 11/07/2014   PSA 0.73 11/03/2012   PSA 0.82 01/01/2010   Hard to get vitamin D covered unless deficiency history - he wants to test this locally and will let me know of results.   Hyperthyroidism S: Thyroid nodule/multinodular goiter- has not taken radioactive iodine through endocrinology.  A/P:Update tsh, t3,t4. Encouraged follow up with Dr. Loanne Drilling in 2 months as planned with last discussion.   Hyperglycemia S: reasonable weight and exercise. Despite this cbg last year 113.  A/P: get a1c. He has read some potential benefits for thyroid on metformin and I agreed if a1c  5.7 or above to go ahead and treat with metformin 500mg  daily.    Essential hypertension S: controlled on amlodipine 10mg  in the past. Average at home 143/82 this year, . In 2017 average was 132/81 BP Readings from Last 3 Encounters:  11/19/16 (!) 140/92  12/13/15 122/84  11/15/15 132/80  A/P:Continue current meds:  BP has been up since knowing about wife's breast cancer. For now since this is first time BP has been up we opted to monitor only  Hyperlipidemia S: reasonably controlled on read yeast rice, fish oil. No myalgias.  Also takes aspirin Lab Results  Component Value Date   CHOL 153 11/15/2015   HDL 43.00 11/15/2015   LDLCALC 98 11/15/2015   TRIG 63.0 11/15/2015   CHOLHDL 4 11/15/2015   A/P: update lipids, maintain meds for now   6 months BP check advised  Orders Placed This Encounter  Procedures  . CBC    [place]  . Comprehensive metabolic panel    [place]    Order Specific Question:   Has the patient fasted?    Answer:   No  . Lipid panel    [place]    Order Specific Question:   Has the patient fasted?    Answer:   No  . Hemoglobin A1c    [place]  . TSH    [place]  . T4, free      . T3, free   Return precautions advised.  Garret Reddish, MD

## 2016-11-19 NOTE — Assessment & Plan Note (Signed)
S: controlled on amlodipine 10mg  in the past. Average at home 143/82 this year, . In 2017 average was 132/81 BP Readings from Last 3 Encounters:  11/19/16 (!) 140/92  12/13/15 122/84  11/15/15 132/80  A/P:Continue current meds:  BP has been up since knowing about wife's breast cancer. For now since this is first time BP has been up we opted to monitor only

## 2016-11-19 NOTE — Assessment & Plan Note (Signed)
S: Thyroid nodule/multinodular goiter- has not taken radioactive iodine through endocrinology.  A/P:Update tsh, t3,t4. Encouraged follow up with Dr. Loanne Drilling in 2 months as planned with last discussion.

## 2016-11-19 NOTE — Assessment & Plan Note (Signed)
S: reasonably controlled on read yeast rice, fish oil. No myalgias.  Also takes aspirin Lab Results  Component Value Date   CHOL 153 11/15/2015   HDL 43.00 11/15/2015   LDLCALC 98 11/15/2015   TRIG 63.0 11/15/2015   CHOLHDL 4 11/15/2015   A/P: update lipids, maintain meds for now

## 2016-11-20 LAB — PSA: PSA: 0.9

## 2016-11-28 ENCOUNTER — Encounter: Payer: Self-pay | Admitting: Family Medicine

## 2016-12-16 ENCOUNTER — Ambulatory Visit: Payer: Medicare Other | Admitting: Family Medicine

## 2016-12-17 DIAGNOSIS — M65312 Trigger thumb, left thumb: Secondary | ICD-10-CM | POA: Diagnosis not present

## 2016-12-24 DIAGNOSIS — Z85828 Personal history of other malignant neoplasm of skin: Secondary | ICD-10-CM | POA: Diagnosis not present

## 2016-12-24 DIAGNOSIS — L821 Other seborrheic keratosis: Secondary | ICD-10-CM | POA: Diagnosis not present

## 2016-12-24 DIAGNOSIS — D1800 Hemangioma unspecified site: Secondary | ICD-10-CM | POA: Diagnosis not present

## 2016-12-24 DIAGNOSIS — D225 Melanocytic nevi of trunk: Secondary | ICD-10-CM | POA: Diagnosis not present

## 2016-12-24 DIAGNOSIS — L814 Other melanin hyperpigmentation: Secondary | ICD-10-CM | POA: Diagnosis not present

## 2017-01-07 ENCOUNTER — Encounter: Payer: Self-pay | Admitting: Family Medicine

## 2017-01-07 ENCOUNTER — Other Ambulatory Visit: Payer: Self-pay

## 2017-01-07 MED ORDER — AMLODIPINE BESYLATE 10 MG PO TABS: 10.0000 mg | ORAL_TABLET | Freq: Every day | ORAL | 3 refills | Status: DC

## 2017-03-14 ENCOUNTER — Encounter: Payer: Self-pay | Admitting: Family Medicine

## 2017-06-30 DIAGNOSIS — Z23 Encounter for immunization: Secondary | ICD-10-CM | POA: Diagnosis not present

## 2017-07-15 ENCOUNTER — Telehealth: Payer: Self-pay | Admitting: Endocrinology

## 2017-07-15 NOTE — Telephone Encounter (Signed)
Fine with me.  Looks like RAI was requested in 2017, but was never scheduled.

## 2017-07-15 NOTE — Telephone Encounter (Signed)
Patient called in reference to wanting to switch from Dr. Loanne Drilling to Dr. Cruzita Lederer. Please call patient and advise. OK to leave message.

## 2017-07-15 NOTE — Telephone Encounter (Signed)
Ok. Need 45 min for the initial visit.

## 2017-07-21 ENCOUNTER — Encounter: Payer: Self-pay | Admitting: Family Medicine

## 2017-08-13 ENCOUNTER — Ambulatory Visit (INDEPENDENT_AMBULATORY_CARE_PROVIDER_SITE_OTHER): Payer: Medicare Other | Admitting: Internal Medicine

## 2017-08-13 ENCOUNTER — Encounter: Payer: Self-pay | Admitting: Internal Medicine

## 2017-08-13 VITALS — BP 140/84 | HR 61 | Ht 71.0 in | Wt 170.0 lb

## 2017-08-13 DIAGNOSIS — Z8639 Personal history of other endocrine, nutritional and metabolic disease: Secondary | ICD-10-CM | POA: Diagnosis not present

## 2017-08-13 DIAGNOSIS — E042 Nontoxic multinodular goiter: Secondary | ICD-10-CM

## 2017-08-13 LAB — T3, FREE: T3, Free: 3.2 pg/mL (ref 2.3–4.2)

## 2017-08-13 LAB — TSH: TSH: 0.71 u[IU]/mL (ref 0.35–4.50)

## 2017-08-13 LAB — T4, FREE: Free T4: 0.85 ng/dL (ref 0.60–1.60)

## 2017-08-13 NOTE — Patient Instructions (Signed)
Please stop at the lab.  Please come back in 2 years. Please call me before the visit to order the thyroid U/S.

## 2017-08-13 NOTE — Progress Notes (Signed)
Patient ID: Clifford Houston, male   DOB: [birth year], 71 y.o.   MRN: [id number]    HPI  Clifford Houston is a 70 y.o.-year-old male, referred by his PCP, Dr. Yong Channel, for evaluation for multiple thyroid nodules and h/o thyrotoxicosis. He previously saw Dr. Loanne Drilling, but would like a second opinion.  Patient has a long history of thyroid nodules since at least 2007, and has had an FNA of the right inf thyroid nodule (2007): Benign  Thyroid U/S (11/20/2015): Thyroid nodules have slightly increased in size. The right nodule meets criteria for biopsy.  Right inferior nodule has increased from 1.5 to 2 cm Right mid nodule has increased from 1.5 to 1.7 cm   Thyroid uptake and scan (12/28/2015): Normal uptake, 13.7% and also normal scan, without cold nodules  Dr. Loanne Drilling suggested to have RAI treatment, but patient would not like to go ahead with this and would like a second opinion about this treatment.  Pt denies: - feeling nodules in neck - hoarseness - dysphagia - choking - SOB with lying down  I reviewed pt's thyroid tests: Lab Results  Component Value Date   TSH 0.71 11/19/2016   TSH 0.53 11/15/2015   TSH 0.56 11/07/2014   TSH 0.52 11/03/2012   TSH 0.24 (L) 10/06/2012   TSH 0.40 01/01/2010   TSH 0.72 12/16/2007   TSH 0.66 06/26/2007   TSH 0.40 12/26/2006   FREET4 0.91 11/19/2016   FREET4 0.83 11/03/2012    Pt denies: - fatigue - heat intolerance/cold intolerance - tremors - palpitations - anxiety/depression - hyperdefecation/constipation - weight loss/weight gain - dry skin - hair loss  + FH of thyroid ds.: father >> goiter; sister >> hyperthyroidism. Nephew with parathyroidectomy. ? FH of thyroid cancer in aunt in the 73s. No h/o radiation tx to head or neck.  No seaweed or kelp. No recent contrast studies. No steroid use. No herbal supplements. No Biotin supplements or Hair, Skin and Nails vitamins.  Pt also has a history of HTN, HL, skin CA, neck  pain.  ROS: Constitutional: no weight gain/loss, no fatigue, no subjective hyperthermia/hypothermia Eyes: no blurry vision, no xerophthalmia ENT: no sore throat, no nodules palpated in throat, no dysphagia/odynophagia, no hoarseness Cardiovascular: no CP/SOB/palpitations/leg swelling Respiratory: no cough/SOB Gastrointestinal: no N/V/D/C Musculoskeletal: no muscle/+ joint aches (neck) Skin: no rashes Neurological: + slight tremors (chronic)/no numbness/tingling/dizziness Psychiatric: no depression/anxiety  Past Medical History:  Diagnosis Date  . GERD (gastroesophageal reflux disease)   . Hyperlipidemia   . Hypertension   . THYROID NODULE 05/05/2007   Multiple nodules. Biopsy 2007 hyperplastic. No further follow up planned.     Past Surgical History:  Procedure Laterality Date  . THYROID SURGERY     biopsy 2007 benign   Social History   Social History Main Topics  . Smoking status: Former Smoker    Packs/day: 1.00    Years: 6.00    Types: Cigarettes    Quit date: 10/14/1965  . Smokeless tobacco: Never Used  . Alcohol use 0.6 oz/week    1 Standard drinks or equivalent per week  . Drug use: No   Social History Narrative   Married (wife patient of Dr. Regis Bill). 2 sons. 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       Hobbies: golf-runs church golf group, go see grandkids. Walk 3 miles alternating  days with going to [facility]   Current Outpatient Prescriptions on File Prior to Visit  Medication Sig Dispense Refill  . amLODipine (NORVASC) 10 MG tablet Take 1 tablet (10 mg total) by mouth daily. 90 tablet 3  . aspirin 81 MG tablet Take 81 mg by mouth daily.      Marland Kitchen co-enzyme Q-10 30 MG capsule Take 300 mg by mouth daily.     . fish oil-omega-3 fatty acids 1000 MG capsule Take 2 g by mouth daily.      . Multiple Vitamin  (MULTIVITAMIN) tablet Take 1 tablet by mouth 3 (three) times a week.     . Red Yeast Rice Extract (RED YEAST RICE PO) Take 1,200 mg by mouth 3 (three) times a week.      No current facility-administered medications on file prior to visit.    Allergies  Allergen Reactions  . Lisinopril     Mild cough   Family History  Problem Relation Age of Onset  . Heart disease Father 51       smoker  . Hypertension Sister   . Heart attack Brother 51       did not eat well, no medical care  . Hypertension Brother   . Diabetes Mother   . Diabetes Sister   . Thyroid disease Father        goiter  . Thyroid disease Sister        I-131 rx    PE: BP 140/84 (BP Location: Left Arm, Patient Position: Sitting)   Pulse 61   Ht 5\' 11"  (1.803 m)   Wt 170 lb (77.1 kg)   SpO2 97%   BMI 23.71 kg/m  Wt Readings from Last 3 Encounters:  08/13/17 170 lb (77.1 kg)  11/19/16 169 lb 9.6 oz (76.9 kg)  12/13/15 169 lb (76.7 kg)   Constitutional: Normal weight, in NAD Eyes: PERRLA, EOMI, no exophthalmos ENT: moist mucous membranes, no thyromegaly, no cervical lymphadenopathy Cardiovascular: RRR, No MRG Respiratory: CTA B Gastrointestinal: abdomen soft, NT, ND, BS+ Musculoskeletal: no deformities, strength intact in all 4;  Skin: moist, warm, no rashes Neurological: Very slight tremor with outstretched hands (chronic), DTR normal in all 4  ASSESSMENT: 1. Multiple thyroid nodules  2. History of suppressed TSH  PLAN: 1. Multiple thyroid nodules - I reviewed the images of his 2017 thyroid ultrasound and the reports of the 2007, 2011 and 2014 thyroid ultrasounds along with the patient. I pointed out that the dominant nodules are not very large, and have been fairly stable since the 2007ultrasound. At that time, the right mid thyroid nodule was 1.5 cm and the right inferior thyroid nodule was 1.9 cm. These dimensions are not very different than the ones on the 2017 ultrasound. We discussed that this is a  very good sign. Also, the right inferior not does not have a thyroid cancer family history or a personal history of RxTx to head/neck. All these would favor benignity.  - Based on the above, I do not recommend any intervention for the thyroid nodules, although we discussed that in the past, levothyroxine suppression was used to shrink the nodules, but this is a practice that has been abandoned due to the risk of iatrogenic thyrotoxicosis - I plan to repeat another ultrasound in 2 years from now and I advised the patient to contact me before our next visit so I can order the ultrasound and we can review the images when he comes back. He agrees with this plan  and appears relieved.  2. H/o Suppressed TSH - Patient had 1 mildly low TSH in the past, but otherwise normal levels  - We reviewed together his previous TFTs, including the last one in 11/2016  - We'll repeat the TSH, free T4, and free T3 today, but from now on, he can just get the TSH checked by his PCP at his annual physical exam  - Up on his question, I would not recommend radioactive iodine treatment at this point with normal thyroid tests and without a clear toxic adenoma on his uptake and scan.   Office Visit on 08/13/2017  Component Date Value Ref Range Status  . TSH 08/13/2017 0.71  0.35 - 4.50 uIU/mL Final  . Free T4 08/13/2017 0.85  0.60 - 1.60 ng/dL Final   Comment: Specimens from patients who are undergoing biotin therapy and /or ingesting biotin supplements may contain high levels of biotin.  The higher biotin concentration in these specimens interferes with this Free T4 assay.  Specimens that contain high levels  of biotin may cause false high results for this Free T4 assay.  Please interpret results in light of the total clinical presentation of the patient.    . T3, Free 08/13/2017 3.2  2.3 - 4.2 pg/mL Final   All thyroid labs are normal.  Philemon Kingdom, MD PhD [facility] Endocrinology

## 2017-08-19 DIAGNOSIS — H2513 Age-related nuclear cataract, bilateral: Secondary | ICD-10-CM | POA: Diagnosis not present

## 2017-08-19 DIAGNOSIS — H5203 Hypermetropia, bilateral: Secondary | ICD-10-CM | POA: Diagnosis not present

## 2017-09-18 DIAGNOSIS — H25811 Combined forms of age-related cataract, right eye: Secondary | ICD-10-CM | POA: Diagnosis not present

## 2017-09-18 DIAGNOSIS — H2511 Age-related nuclear cataract, right eye: Secondary | ICD-10-CM | POA: Diagnosis not present

## 2017-10-02 DIAGNOSIS — H2512 Age-related nuclear cataract, left eye: Secondary | ICD-10-CM | POA: Diagnosis not present

## 2017-10-02 DIAGNOSIS — H25812 Combined forms of age-related cataract, left eye: Secondary | ICD-10-CM | POA: Diagnosis not present

## 2017-11-05 ENCOUNTER — Telehealth: Payer: Self-pay | Admitting: Family Medicine

## 2017-11-05 NOTE — Telephone Encounter (Signed)
MEDICATION: amLODipine (NORVASC) 10mg  tablet  PHARMACY: [facility]     IS THIS A 90 DAY SUPPLY : yes  IS PATIENT OUT OF MEDICATION: no  IF NOT; HOW MUCH IS LEFT: about 2 weeks left   LAST APPOINTMENT DATE: @10 /11/2016  NEXT APPOINTMENT DATE:@2 /04/2018  OTHER COMMENTS: Patient states that this prescription service delivers.    **Let patient know to contact pharmacy at the end of the day to make sure medication is ready. **  ** Please notify patient to allow 48-72 hours to process**  **Encourage patient to contact the pharmacy for refills or they can request refills through [facility]**

## 2017-11-06 ENCOUNTER — Other Ambulatory Visit: Payer: Self-pay

## 2017-11-06 MED ORDER — AMLODIPINE BESYLATE 10 MG PO TABS: 10.0000 mg | ORAL_TABLET | Freq: Every day | ORAL | 3 refills | Status: DC

## 2017-11-07 ENCOUNTER — Telehealth: Payer: Self-pay | Admitting: Family Medicine

## 2017-11-07 NOTE — Telephone Encounter (Signed)
Copied from [place] [id number]. Topic: Quick Communication - Rx Refill/Question >> Nov 07, 2017  8:22 AM Robina Ade, Helene Kelp D wrote: Medication: amLODipine (NORVASC) 10 MG tablet Has the patient contacted their pharmacy? Yes (Agent: If no, request that the patient contact the pharmacy for the refill.) Preferred Pharmacy (with phone number or street name): [facility] Agent: Please be advised that RX refills may take up to 3 business days. We ask that you follow-up with your pharmacy. Patient called and said his medication was sent to the wrong location, he needs it to be sent to [place]. Please call patient if there are any questions, thanks.

## 2017-11-15 DIAGNOSIS — H43811 Vitreous degeneration, right eye: Secondary | ICD-10-CM | POA: Diagnosis not present

## 2017-11-18 DIAGNOSIS — H43811 Vitreous degeneration, right eye: Secondary | ICD-10-CM | POA: Diagnosis not present

## 2017-11-18 DIAGNOSIS — H4311 Vitreous hemorrhage, right eye: Secondary | ICD-10-CM | POA: Diagnosis not present

## 2017-11-20 ENCOUNTER — Ambulatory Visit (INDEPENDENT_AMBULATORY_CARE_PROVIDER_SITE_OTHER): Payer: Medicare Other | Admitting: Family Medicine

## 2017-11-20 ENCOUNTER — Encounter: Payer: Self-pay | Admitting: Family Medicine

## 2017-11-20 ENCOUNTER — Other Ambulatory Visit: Payer: Self-pay

## 2017-11-20 VITALS — BP 142/82 | HR 71 | Temp 98.3°F | Ht 71.0 in | Wt 172.2 lb

## 2017-11-20 DIAGNOSIS — M5412 Radiculopathy, cervical region: Secondary | ICD-10-CM

## 2017-11-20 DIAGNOSIS — I10 Essential (primary) hypertension: Secondary | ICD-10-CM

## 2017-11-20 DIAGNOSIS — R739 Hyperglycemia, unspecified: Secondary | ICD-10-CM

## 2017-11-20 DIAGNOSIS — R351 Nocturia: Secondary | ICD-10-CM

## 2017-11-20 DIAGNOSIS — Z87891 Personal history of nicotine dependence: Secondary | ICD-10-CM | POA: Diagnosis not present

## 2017-11-20 DIAGNOSIS — E042 Nontoxic multinodular goiter: Secondary | ICD-10-CM | POA: Diagnosis not present

## 2017-11-20 DIAGNOSIS — E785 Hyperlipidemia, unspecified: Secondary | ICD-10-CM | POA: Diagnosis not present

## 2017-11-20 LAB — COMPREHENSIVE METABOLIC PANEL
ALBUMIN: 4.5 g/dL (ref 3.5–5.2)
ALK PHOS: 49 U/L (ref 39–117)
ALT: 16 U/L (ref 0–53)
AST: 22 U/L (ref 0–37)
BILIRUBIN TOTAL: 0.5 mg/dL (ref 0.2–1.2)
BUN: 15 mg/dL (ref 6–23)
CALCIUM: 9.5 mg/dL (ref 8.4–10.5)
CHLORIDE: 105 meq/L (ref 96–112)
CO2: 28 mEq/L (ref 19–32)
CREATININE: 1.02 mg/dL (ref 0.40–1.50)
GFR: 76.64 mL/min (ref >60.00)
Glucose, Bld: 106 mg/dL — ABNORMAL HIGH (ref 70–99)
Potassium: 4.3 mEq/L (ref 3.5–5.1)
SODIUM: 139 meq/L (ref 135–145)
TOTAL PROTEIN: 7.4 g/dL (ref 6.0–8.3)

## 2017-11-20 LAB — PSA: PSA: 1.13 ng/mL (ref 0.10–4.00)

## 2017-11-20 LAB — CBC
HCT: 39.9 % (ref 39.0–52.0)
Hemoglobin: 13.7 g/dL (ref 13.0–17.0)
MCHC: 34.4 g/dL (ref 30.0–36.0)
MCV: 93.3 fl (ref 78.0–100.0)
PLATELETS: 198 10*3/uL (ref 150.0–400.0)
RBC: 4.27 Mil/uL (ref 4.22–5.81)
RDW: 13.1 % (ref 11.5–15.5)
WBC: 4.3 10*3/uL (ref 4.0–10.5)

## 2017-11-20 LAB — LIPID PANEL
CHOLESTEROL: 153 mg/dL (ref 0–200)
HDL: 40.8 mg/dL (ref >39.00)
LDL CALC: 102 mg/dL — AB (ref 0–99)
NonHDL: 112.59
Total CHOL/HDL Ratio: 4
Triglycerides: 51 mg/dL (ref 0.0–149.0)
VLDL: 10.2 mg/dL (ref 0.0–40.0)

## 2017-11-20 LAB — POC URINALSYSI DIPSTICK (AUTOMATED)
Bilirubin, UA: NEGATIVE
GLUCOSE UA: NEGATIVE
Ketones, UA: NEGATIVE
Leukocytes, UA: NEGATIVE
Nitrite, UA: NEGATIVE
PH UA: 6 (ref 5.0–8.0)
Protein, UA: NEGATIVE
RBC UA: NEGATIVE
Spec Grav, UA: 1.015 (ref 1.010–1.025)
UROBILINOGEN UA: 0.2 U/dL

## 2017-11-20 LAB — HEMOGLOBIN A1C: HEMOGLOBIN A1C: 5.7 % (ref 4.6–6.5)

## 2017-11-20 LAB — TSH: TSH: 0.47 u[IU]/mL (ref 0.35–4.50)

## 2017-11-20 MED ORDER — AMLODIPINE BESYLATE 10 MG PO TABS: 10.0000 mg | ORAL_TABLET | Freq: Every day | ORAL | 3 refills | Status: DC

## 2017-11-20 NOTE — Patient Instructions (Addendum)
Please stop by lab before you go  No changes today  If you want to see me just once a year, could see one of our wellness nurses at 6 month interval to make sure blood pressure continues to do well at home-->   I would also like for you to sign up for an annual wellness visit with one of our nurses, Cassie or Manuela Schwartz, who both specialize in the annual wellness visit. This is a free benefit under medicare that may help [place] find additional ways to help you. Some highlights are reviewing medications, lifestyle, and doing a dementia screen.   Lab Results  Component Value Date   CHOL 173 11/19/2016   HDL 51.50 11/19/2016   LDLCALC 109 (H) 11/19/2016   TRIG 64.0 11/19/2016   CHOLHDL 3 11/19/2016   You could get shingrix at your pharmacy

## 2017-11-20 NOTE — Assessment & Plan Note (Addendum)
S: mild poorly controlled on red yeast rice, fish oil. No myalgias. He takes aspirin Lab Results  Component Value Date   CHOL 173 11/19/2016   HDL 51.50 11/19/2016   LDLCALC 109 (H) 11/19/2016   TRIG 64.0 11/19/2016   CHOLHDL 3 11/19/2016   A/P: update lipids. Using last years # risk was 22.7% even with red yeast rice- primary driver here is age.

## 2017-11-20 NOTE — Addendum Note (Signed)
Addended by: Frutoso Chase A on: 11/20/2017 09:28 AM   Modules accepted: Orders

## 2017-11-20 NOTE — Progress Notes (Signed)
Subjective:  Clifford Houston is a 72 y.o. year old very pleasant male patient who presents for/with See problem oriented charting ROS- No chest pain or shortness of breath. No headache or blurry vision.    Past Medical History-  Patient Active Problem List   Diagnosis Date Noted  . Hyperglycemia 11/19/2016    Priority: Medium  . Hyperlipidemia 05/05/2007    Priority: Medium  . Essential hypertension 05/05/2007    Priority: Medium  . Former smoker 11/14/2014    Priority: Low  . History of skin cancer 11/07/2014    Priority: Low  . GERD 05/05/2007    Priority: Low  . Cervical radiculopathy 11/20/2017  . Multiple thyroid nodules 08/13/2017  . History of thyrotoxicosis 08/13/2017    Medications- revieed and updated Current Outpatient Medications  Medication Sig Dispense Refill  . aspirin 81 MG tablet Take 81 mg by mouth daily.      Marland Kitchen co-enzyme Q-10 30 MG capsule Take 300 mg by mouth daily.     . fish oil-omega-3 fatty acids 1000 MG capsule Take 2 g by mouth daily.      . Multiple Vitamin (MULTIVITAMIN) tablet Take 1 tablet by mouth 3 (three) times a week.     . Red Yeast Rice Extract (RED YEAST RICE PO) Take 600 mg by mouth 4 (four) times a week.     Marland Kitchen amLODipine (NORVASC) 10 MG tablet Take 1 tablet (10 mg total) by mouth daily. 90 tablet 3   Objective: BP (!) 142/82 (BP Location: Left Arm, Patient Position: Sitting, Cuff Size: Large)   Pulse 71   Temp 98.3 F (36.8 C) (Oral)   Ht 5\' 11"  (1.803 m)   Wt 172 lb 3.2 oz (78.1 kg)   SpO2 98%   BMI 24.02 kg/m  Gen: NAD, resting comfortably Tm normal, oropharynx normal thyromegaly noted CV: RRR no murmurs rubs or gallops Lungs: CTAB no crackles, wheeze, rhonchi Abdomen: soft/nontender/nondistended/normal bowel sounds. No rebound or guarding.  Ext: no edema Skin: warm, dry, no rahs  Assessment/Plan:  Other issues 1. 10 year colonoscopy with last in 2014 Dr. Collene Mares 2. Wants PSA. Has nocturia nightly. Last year PSA 0.9 at  anylabtest- now appears to go through with nocturia so will screen here. He wants to screen until 76 at least 3. Sinuses getting worse each winter- neti pot helping some. Having blood clots. May want to consider ENT in future. Does not use antihistamine  Essential hypertension S: controlled porly in office on amlodipine 10mg . Last 10  home #average 136/82. Average for 2018 was 135/81 BP Readings from Last 3 Encounters:  11/20/17 (!) 142/82  08/13/17 140/84  11/19/16 (!) 140/92  A/P: We discussed blood pressure goal of <140/90. Continue current meds:  Since he has excellent home control of BP  Hyperlipidemia S: mild poorly controlled on red yeast rice, fish oil. No myalgias. He takes aspirin Lab Results  Component Value Date   CHOL 173 11/19/2016   HDL 51.50 11/19/2016   LDLCALC 109 (H) 11/19/2016   TRIG 64.0 11/19/2016   CHOLHDL 3 11/19/2016   A/P: update lipids. Using last years # risk was 22.7% even with red yeast rice- primary driver here is age.   Hyperglycemia S: hyperglycemia despite reasonable weight and exercise. Luckily a1c was not elevated at last visit despite CBG being up some. Patient is interested in metformin if a1c gets above 5.7.  Lab Results  Component Value Date   HGBA1C 5.6 11/19/2016  A/P: update bmp and  a1c  Cervical radiculopathy Left arm/neck pain- had MRI years ago and opted out of surgery years ago. Was able to work through this with physical therapy. He does not have numbness this time like last time. Compression of disc C4 c5 he was told. 4-5 years ago had issues with the right arm. Prednisone did not help. 2 tylenol help.   Advised 6 month follow up  Lab/Order associations: Essential hypertension - Plan: CBC, Comprehensive metabolic panel, Lipid panel, POCT Urinalysis Dipstick (Automated)  Hyperglycemia - Plan: Hemoglobin A1c  Hyperlipidemia, unspecified hyperlipidemia type - Plan: CBC, Comprehensive metabolic panel, Lipid panel, POCT Urinalysis  Dipstick (Automated)  Multiple thyroid nodules - Plan: TSH  Former smoker - Plan: POCT Urinalysis Dipstick (Automated)  Nocturia - Plan: PSA  Return precautions advised.  Garret Reddish, MD

## 2017-11-20 NOTE — Assessment & Plan Note (Signed)
Left arm/neck pain- had MRI years ago and opted out of surgery years ago. Was able to work through this with physical therapy. He does not have numbness this time like last time. Compression of disc C4 c5 he was told. 4-5 years ago had issues with the right arm. Prednisone did not help. 2 tylenol help.

## 2017-11-20 NOTE — Assessment & Plan Note (Signed)
S: hyperglycemia despite reasonable weight and exercise. Luckily a1c was not elevated at last visit despite CBG being up some. Patient is interested in metformin if a1c gets above 5.7.  Lab Results  Component Value Date   HGBA1C 5.6 11/19/2016  A/P: update bmp and a1c

## 2017-11-20 NOTE — Assessment & Plan Note (Signed)
S: controlled porly in office on amlodipine 10mg . Last 10  home #average 136/82. Average for 2018 was 135/81 BP Readings from Last 3 Encounters:  11/20/17 (!) 142/82  08/13/17 140/84  11/19/16 (!) 140/92  A/P: We discussed blood pressure goal of <140/90. Continue current meds:  Since he has excellent home control of BP

## 2017-11-21 ENCOUNTER — Encounter: Payer: Self-pay | Admitting: Family Medicine

## 2017-11-24 ENCOUNTER — Telehealth: Payer: Self-pay | Admitting: Family Medicine

## 2017-11-24 NOTE — Telephone Encounter (Signed)
Copied from [place]. Topic: General - Other >> Nov 24, 2017 11:07 AM Lolita Rieger, RMA wrote: Reason for CRM: pt called and stated that he is headed out of town and needs some samples of amlodipine because he has not received his order form his mail order pharmacy please call pt [phone]

## 2017-11-24 NOTE — Telephone Encounter (Signed)
Please advise 

## 2017-11-25 ENCOUNTER — Encounter: Payer: Self-pay | Admitting: Family Medicine

## 2017-11-25 ENCOUNTER — Other Ambulatory Visit: Payer: Self-pay

## 2017-11-25 MED ORDER — AMLODIPINE BESYLATE 10 MG PO TABS: 10.0000 mg | ORAL_TABLET | Freq: Every day | ORAL | 0 refills | Status: DC

## 2017-11-26 NOTE — Telephone Encounter (Signed)
Informed patient that we don't have those samples here in the office and I sent in a 30 day prescription to the local retail pharmacy. I corresponded with patient via My Chart who is aware

## 2017-12-22 ENCOUNTER — Other Ambulatory Visit: Payer: Self-pay | Admitting: Family Medicine

## 2017-12-22 DIAGNOSIS — H43811 Vitreous degeneration, right eye: Secondary | ICD-10-CM | POA: Diagnosis not present

## 2017-12-22 DIAGNOSIS — H4311 Vitreous hemorrhage, right eye: Secondary | ICD-10-CM | POA: Diagnosis not present

## 2017-12-24 DIAGNOSIS — D1800 Hemangioma unspecified site: Secondary | ICD-10-CM | POA: Diagnosis not present

## 2017-12-24 DIAGNOSIS — L853 Xerosis cutis: Secondary | ICD-10-CM | POA: Diagnosis not present

## 2017-12-24 DIAGNOSIS — Z23 Encounter for immunization: Secondary | ICD-10-CM | POA: Diagnosis not present

## 2017-12-24 DIAGNOSIS — L814 Other melanin hyperpigmentation: Secondary | ICD-10-CM | POA: Diagnosis not present

## 2017-12-24 DIAGNOSIS — Z85828 Personal history of other malignant neoplasm of skin: Secondary | ICD-10-CM | POA: Diagnosis not present

## 2017-12-24 DIAGNOSIS — D225 Melanocytic nevi of trunk: Secondary | ICD-10-CM | POA: Diagnosis not present

## 2017-12-24 DIAGNOSIS — L821 Other seborrheic keratosis: Secondary | ICD-10-CM | POA: Diagnosis not present

## 2018-02-03 DIAGNOSIS — Z1211 Encounter for screening for malignant neoplasm of colon: Secondary | ICD-10-CM | POA: Diagnosis not present

## 2018-02-03 DIAGNOSIS — K5730 Diverticulosis of large intestine without perforation or abscess without bleeding: Secondary | ICD-10-CM | POA: Diagnosis not present

## 2018-02-03 DIAGNOSIS — Z86010 Personal history of colonic polyps: Secondary | ICD-10-CM | POA: Diagnosis not present

## 2018-03-04 DIAGNOSIS — Z86010 Personal history of colonic polyps: Secondary | ICD-10-CM | POA: Diagnosis not present

## 2018-03-04 DIAGNOSIS — Z1211 Encounter for screening for malignant neoplasm of colon: Secondary | ICD-10-CM | POA: Diagnosis not present

## 2018-03-04 LAB — HM COLONOSCOPY

## 2018-03-06 ENCOUNTER — Encounter: Payer: Self-pay | Admitting: Family Medicine

## 2018-05-21 ENCOUNTER — Ambulatory Visit: Payer: Medicare Other | Admitting: *Deleted

## 2018-06-10 ENCOUNTER — Ambulatory Visit (INDEPENDENT_AMBULATORY_CARE_PROVIDER_SITE_OTHER): Payer: Medicare Other

## 2018-06-10 VITALS — BP 138/78 | HR 61 | Ht 71.0 in | Wt 167.8 lb

## 2018-06-10 DIAGNOSIS — Z0000 Encounter for general adult medical examination without abnormal findings: Secondary | ICD-10-CM | POA: Diagnosis not present

## 2018-06-10 NOTE — Progress Notes (Addendum)
Subjective:   Clifford Houston is a 72 y.o. male who presents for Medicare Annual/Subsequent preventive examination.  Review of Systems:  No ROS.  Medicare Wellness Visit. Additional risk factors are reflected in the social history. Cardiac Risk Factors include: advanced age (>82men, >55 women);male gender Patient currently lives with wife of 17 years in a 2 story house. No pets. Patient plays golf about once a month, involved with church [employer] [place], mens poker group once a month. Enjoys reading, watching sports, goes to [place] once a week to care for Father in Sports coach. Sons live in [place] and [place].    Patient goes to bed around 10:30 at night. Gets up about twice a night to go to the bathroom. Gets up around 5-6:30. Feels rested when he wakes up most mornings.    Objective:    Vitals: BP 138/78 (BP Location: Left Arm, Patient Position: Sitting, Cuff Size: Large)   Pulse 61   Ht 5\' 11"  (1.803 m)   Wt 167 lb 12.8 oz (76.1 kg)   SpO2 97%   BMI 23.40 kg/m   Body mass index is 23.4 kg/m.  Advanced Directives 06/10/2018  Does Patient Have a Medical Advance Directive? Yes  Type of Paramedic of [place];Living will  Does patient want to make changes to medical advance directive? No - Patient declined  Copy of [place] in Chart? No - copy requested    Tobacco Social History   Tobacco Use  Smoking Status Former Smoker  . Packs/day: 1.00  . Years: 6.00  . Pack years: 6.00  . Types: Cigarettes  . Last attempt to quit: 10/14/1965  . Years since quitting: 52.6  Smokeless Tobacco Never Used     Counseling given: Not Answered   Past Medical History:  Diagnosis Date  . GERD (gastroesophageal reflux disease)   . Hyperlipidemia   . Hypertension   . THYROID NODULE 05/05/2007   Multiple nodules. Biopsy 2007 hyperplastic. No further follow up planned.     Past Surgical History:  Procedure Laterality Date  . CATARACT EXTRACTION,  BILATERAL     dec 6th 2018 right eye, dec 20th 2018 left eye  . THYROID SURGERY     biopsy 2007 benign   Family History  Problem Relation Age of Onset  . Heart disease Father 57       smoker  . Thyroid disease Father        goiter  . Hypertension Sister   . Asthma Sister   . Emphysema Sister   . Heart attack Brother 51       did not eat well, no medical care  . Hypertension Brother   . Heart disease Brother   . Diabetes Mother   . Stroke Mother   . Diabetes Sister   . Thyroid disease Sister   . Hypertension Sister   . Hypertension Brother    Social History   Socioeconomic History  . Marital status: Married    Spouse name: Not on file  . Number of children: Not on file  . Years of education: Not on file  . Highest education level: Not on file  Occupational History  . Not on file  Social Needs  . Financial resource strain: Not on file  . Food insecurity:    Worry: Not on file    Inability: Not on file  . Transportation needs:    Medical: Not on file    Non-medical: Not on file  Tobacco  Use  . Smoking status: Former Smoker    Packs/day: 1.00    Years: 6.00    Pack years: 6.00    Types: Cigarettes    Last attempt to quit: 10/14/1965    Years since quitting: 52.6  . Smokeless tobacco: Never Used  Substance and Sexual Activity  . Alcohol use: Yes    Alcohol/week: 1.0 standard drinks    Types: 1 Standard drinks or equivalent per week  . Drug use: No  . Sexual activity: Not on file  Lifestyle  . Physical activity:    Days per week: Not on file    Minutes per session: Not on file  . Stress: Not on file  Relationships  . Social connections:    Talks on phone: Not on file    Gets together: Not on file    Attends religious service: Not on file    Active member of club or organization: Not on file    Attends meetings of clubs or organizations: Not on file    Relationship status: Not on file  Other Topics Concern  . Not on file  Social History Narrative    Married (wife patient of Dr. Regis Bill). 2 sons. 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       Hobbies: golf-runs church golf group, go see grandkids. Walk 3 miles alternating days with going to [facility]    Outpatient Encounter Medications as of 06/10/2018  Medication Sig  . amLODipine (NORVASC) 10 MG tablet TAKE 1 TABLET BY MOUTH EVERY DAY  . aspirin 81 MG tablet Take 81 mg by mouth 3 (three) times a week.   . co-enzyme Q-10 30 MG capsule Take 300 mg by mouth daily.   . fish oil-omega-3 fatty acids 1000 MG capsule Take 2 g by mouth daily.    . Multiple Vitamin (MULTIVITAMIN) tablet Take 1 tablet by mouth 3 (three) times a week.   . Red Yeast Rice Extract (RED YEAST RICE PO) Take 600 mg by mouth 4 (four) times a week.    No facility-administered encounter medications on file as of 06/10/2018.     Activities of Daily Living In your present state of health, do you have any difficulty performing the following activities: 06/10/2018  Hearing? N  Vision? N  Difficulty concentrating or making decisions? N  Walking or climbing stairs? N  Dressing or bathing? N  Doing errands, shopping? N  Preparing Food and eating ? N  Using the Toilet? N  In the past six months, have you accidently leaked urine? N  Do you have problems with loss of bowel control? N  Managing your Medications? N  Managing your Finances? N  Housekeeping or managing your Housekeeping? N  Some recent data might be hidden    Patient Care Team: Marin Olp, MD as PCP - General (Family Medicine) Juanita Craver, MD as Attending Physician (Gastroenterology)   Assessment:   This is a routine wellness examination for [place].  Exercise Activities and Dietary recommendations Current Exercise Habits: Structured exercise class([employer]), Type of exercise: strength  training/weights;Other - see comments(biking, elleptical), Time (Minutes): 60, Frequency (Times/Week): 3, Weekly Exercise (Minutes/Week): 180, Intensity: Moderate, Exercise limited by: orthopedic condition(s)   Breakfast: Granola, yogurt, fruit, toast, 2 cups of coffee a day, a cup of milk  Lunch: Leftovers normally, goes out occasionally, whole wheat  bread with tuna salad, occasionally a smoothie  Dinner: Pasta, salad, black beans, rice, lentils, drinks water, occasionally a sweet tea  No soft drinks, prefers salty over sweet Goals    . Increase physical activity     Maintain active life style working out at [employer] and drinking water       Fall Risk Fall Risk  06/10/2018 11/20/2017 11/19/2016 11/15/2015 11/07/2014  Falls in the past year? No No No No No     Depression Screen PHQ 2/9 Scores 06/10/2018 11/20/2017 11/19/2016 11/15/2015  PHQ - 2 Score 0 0 0 0    Cognitive Function        Immunization History  Administered Date(s) Administered  . Influenza Split 07/14/2012, 06/28/2013  . Influenza-Unspecified 07/17/2014, 06/15/2015, 07/31/2016, 06/30/2017  . Pneumococcal Conjugate-13 11/07/2014  . Pneumococcal Polysaccharide-23 10/06/2012  . Tdap 01/08/2010  . Zoster 01/08/2010      Screening Tests Health Maintenance  Topic Date Due  . INFLUENZA VACCINE  05/14/2018  . TETANUS/TDAP  01/09/2020  . COLONOSCOPY  03/04/2028  . PNA vac Low Risk Adult  Completed  . Hepatitis C Screening  Addressed        Plan:    Follow Up with PCP as advised  I have personally reviewed and noted the following in the patient's chart:   . Medical and social history . Use of alcohol, tobacco or illicit drugs  . Current medications and supplements . Functional ability and status . Nutritional status . Physical activity . Advanced directives . List of other physicians . Vitals . Screenings to include cognitive, depression, and falls . Referrals and appointments  In addition, I have reviewed  and discussed with patient certain preventive protocols, quality metrics, and best practice recommendations. A written personalized care plan for preventive services as well as general preventive health recommendations were provided to patient.     [place], LPN  12/27/1759  I have personally reviewed the Medicare Annual Wellness questionnaire and have noted 1. The patient's medical and social history 2. Their use of alcohol, tobacco or illicit drugs 3. Their current medications and supplements 4. The patient's functional ability including ADL's, fall risks, home safety risks and hearing or visual impairment. 5. Diet and physical activities 6. Evidence for depression or mood disorders 7. Reviewed Updated provider list, see scanned forms and [facility] Snapshot.   The patients weight, height, BMI and visual acuity have been recorded in the chart I have made referrals, counseling and provided education to the patient based review of the above and I have provided the pt with a written personalized care plan for preventive services.  I have provided the patient with a copy of your personalized plan for preventive services. Instructed to take the time to review along with their updated medication list.  Inda Coke PA-C 06/10/18

## 2018-06-10 NOTE — Patient Instructions (Addendum)
Mr. Riegler , Thank you for taking time to come for your Medicare Wellness Visit. I appreciate your ongoing commitment to your health goals. Please review the following plan we discussed and let me know if I can assist you in the future.   These are the goals we discussed: Goals    . Increase physical activity     Maintain active life style working out at [employer] and drinking water       This is a list of the screening recommended for you and due dates:  Health Maintenance  Topic Date Due  . Flu Shot  05/14/2018  . Tetanus Vaccine  01/09/2020  . Colon Cancer Screening  03/04/2028  . Pneumonia vaccines  Completed  .  Hepatitis C: One time screening is recommended by Center for Disease Control  (CDC) for  adults born from [birth year] through 1964.   Addressed   Preventive Care for Adults  A healthy lifestyle and preventive care can promote health and wellness. Preventive health guidelines for adults include the following key practices.  . A routine yearly physical is a good way to check with your health care provider about your health and preventive screening. It is a chance to share any concerns and updates on your health and to receive a thorough exam.  . Visit your dentist for a routine exam and preventive care every 6 months. Brush your teeth twice a day and floss once a day. Good oral hygiene prevents tooth decay and gum disease.  . The frequency of eye exams is based on your age, health, family medical history, use  of contact lenses, and other factors. Follow your health care provider's recommendations for frequency of eye exams.  . Eat a healthy diet. Foods like vegetables, fruits, whole grains, low-fat dairy products, and lean protein foods contain the nutrients you need without too many calories. Decrease your intake of foods high in solid fats, added sugars, and salt. Eat the right amount of calories for you. Get information about a proper diet from your health care provider, if  necessary.  . Regular physical exercise is one of the most important things you can do for your health. Most adults should get at least 150 minutes of moderate-intensity exercise (any activity that increases your heart rate and causes you to sweat) each week. In addition, most adults need muscle-strengthening exercises on 2 or more days a week.  Silver Sneakers may be a benefit available to you. To determine eligibility, you may visit the website: www.silversneakers.com or contact program at 310-371-5934 Mon-Fri between 8AM-8PM.   . Maintain a healthy weight. The body mass index (BMI) is a screening tool to identify possible weight problems. It provides an estimate of body fat based on height and weight. Your health care provider can find your BMI and can help you achieve or maintain a healthy weight.   For adults 20 years and older: ? A BMI below 18.5 is considered underweight. ? A BMI of 18.5 to 24.9 is normal. ? A BMI of 25 to 29.9 is considered overweight. ? A BMI of 30 and above is considered obese.   . Maintain normal blood lipids and cholesterol levels by exercising and minimizing your intake of saturated fat. Eat a balanced diet with plenty of fruit and vegetables. Blood tests for lipids and cholesterol should begin at age 43 and be repeated every 5 years. If your lipid or cholesterol levels are high, you are over 50, or you are at high risk  for heart disease, you may need your cholesterol levels checked more frequently. Ongoing high lipid and cholesterol levels should be treated with medicines if diet and exercise are not working.  . If you smoke, find out from your health care provider how to quit. If you do not use tobacco, please do not start.  . If you choose to drink alcohol, please do not consume more than 2 drinks per day. One drink is considered to be 12 ounces (355 mL) of beer, 5 ounces (148 mL) of wine, or 1.5 ounces (44 mL) of liquor.  . If you are 75-84 years old, ask your  health care provider if you should take aspirin to prevent strokes.  . Use sunscreen. Apply sunscreen liberally and repeatedly throughout the day. You should seek shade when your shadow is shorter than you. Protect yourself by wearing long sleeves, pants, a wide-brimmed hat, and sunglasses year round, whenever you are outdoors.  . Once a month, do a whole body skin exam, using a mirror to look at the skin on your back. Tell your health care provider of new moles, moles that have irregular borders, moles that are larger than a pencil eraser, or moles that have changed in shape or color.

## 2018-06-10 NOTE — Progress Notes (Signed)
PCP notes: Last OV 11/20/17 S: controlled porly in office on amlodipine 10mg . Last 10  home #average 136/82. Average for 2018 was 135/81    BP Readings from Last 3 Encounters:  11/20/17 (!) 142/82  08/13/17 140/84  11/19/16 (!) 140/92  A/P: We discussed blood pressure goal of <140/90. Continue current meds:  Since he has excellent home control of BP  Advised 6 month follow up- Scheduled appointment with Dr. Yong Channel    Health maintenance: Schedule Flu Shot for this Fall   Abnormal screenings: None   Patient concerns:Patient dropped off home blood pressure readings today for Dr. Yong Channel to review.   Nurse concerns:None. Scheduled patient for a follow up visit based off of Dr. Yong Channel recommendation for a 6 month follow up   Next PCP appt: 11/23/2018

## 2018-06-11 ENCOUNTER — Encounter: Payer: Self-pay | Admitting: Family Medicine

## 2018-06-29 ENCOUNTER — Ambulatory Visit (INDEPENDENT_AMBULATORY_CARE_PROVIDER_SITE_OTHER): Payer: Medicare Other | Admitting: Family Medicine

## 2018-06-29 ENCOUNTER — Encounter: Payer: Self-pay | Admitting: Family Medicine

## 2018-06-29 VITALS — BP 130/74 | HR 64 | Temp 98.3°F | Ht 71.0 in | Wt 169.8 lb

## 2018-06-29 DIAGNOSIS — R739 Hyperglycemia, unspecified: Secondary | ICD-10-CM

## 2018-06-29 DIAGNOSIS — M5412 Radiculopathy, cervical region: Secondary | ICD-10-CM | POA: Diagnosis not present

## 2018-06-29 DIAGNOSIS — Z23 Encounter for immunization: Secondary | ICD-10-CM

## 2018-06-29 DIAGNOSIS — I10 Essential (primary) hypertension: Secondary | ICD-10-CM

## 2018-06-29 DIAGNOSIS — E785 Hyperlipidemia, unspecified: Secondary | ICD-10-CM | POA: Diagnosis not present

## 2018-06-29 LAB — HEMOGLOBIN A1C: HEMOGLOBIN A1C: 5.7 % (ref 4.6–6.5)

## 2018-06-29 LAB — LDL CHOLESTEROL, DIRECT: LDL DIRECT: 109 mg/dL

## 2018-06-29 NOTE — Assessment & Plan Note (Signed)
Neck has been much better in regards to cervical arthritis with going from thicker pillow to thinner pillow. Had been using CBD oil and not having to anymore.

## 2018-06-29 NOTE — Progress Notes (Signed)
Subjective:  Clifford Houston is a 72 y.o. year old very pleasant male patient who presents for/with See problem oriented charting ROS- No chest pain or shortness of breath. No headache or blurry vision.    Past Medical History-  Patient Active Problem List   Diagnosis Date Noted  . Hyperglycemia 11/19/2016    Priority: Medium  . Hyperlipidemia 05/05/2007    Priority: Medium  . Essential hypertension 05/05/2007    Priority: Medium  . Former smoker 11/14/2014    Priority: Low  . History of skin cancer 11/07/2014    Priority: Low  . GERD 05/05/2007    Priority: Low  . Cervical radiculopathy 11/20/2017  . Multiple thyroid nodules 08/13/2017  . History of thyrotoxicosis 08/13/2017    Medications- reviewed and updated Current Outpatient Medications  Medication Sig Dispense Refill  . amLODipine (NORVASC) 10 MG tablet TAKE 1 TABLET BY MOUTH EVERY DAY 30 tablet 5  . aspirin 81 MG tablet Take 81 mg by mouth 3 (three) times a week.     . co-enzyme Q-10 30 MG capsule Take 300 mg by mouth daily.     . fish oil-omega-3 fatty acids 1000 MG capsule Take 2 g by mouth daily.      . Multiple Vitamin (MULTIVITAMIN) tablet Take 1 tablet by mouth 3 (three) times a week.     . Red Yeast Rice Extract (RED YEAST RICE PO) Take 600 mg by mouth 4 (four) times a week.      No current facility-administered medications for this visit.     Objective: BP 130/74 (BP Location: Left Arm, Patient Position: Sitting, Cuff Size: Large)   Pulse 64   Temp 98.3 F (36.8 C) (Oral)   Ht 5\' 11"  (1.803 m)   Wt 169 lb 12.8 oz (77 kg)   SpO2 97%   BMI 23.68 kg/m  Gen: NAD, resting comfortably CV: RRR no murmurs rubs or gallops Lungs: CTAB no crackles, wheeze, rhonchi Abdomen: soft/nontender/nondistended/normal bowel sounds. Healthy weight Ext: no edema Skin: warm, dry, no rash over ankles  Assessment/Plan:  Hypertension S: controlled in office and at home on amlodipine 10mg . Home Bps remain excellent with  last 10 average 138/78. Marland Kitchen Last visit average was 135/80.  BP Readings from Last 3 Encounters:  06/29/18 130/74  06/10/18 138/78  11/20/17 (!) 142/82  A/P: We discussed blood pressure goal of <140/90. Continue current meds  Hyperlipidemia S: very mildly poorly controlled on read yeast rice, fish oil. Takes aspirin MWF though we discussed today benefits compared to risks at age 58 were unclear- since this risk benefit analysis may be skewed by statin use and hes not on statin and 10 year risk 24%- opted to continue statin. Age is primary driver of his risk.   Dad did have heart disease but was smoker and did not eat as healthy Lab Results  Component Value Date   CHOL 153 11/20/2017   HDL 40.80 11/20/2017   LDLCALC 102 (H) 11/20/2017   TRIG 51.0 11/20/2017   CHOLHDL 4 11/20/2017   A/P: he would like to check LDL today- hoping for # under 100.   Hyperglycemia S: mild increased risk of diabetes. He wants to consider metformin if risks get much more elevated though . Exercising 2-3 days a week. Likes to do MWF but caretaking for father in law has been hard for him  He is doing small amount of apple cidar vinegar along with natural apple juice non pasteurized.  Lab Results  Component Value  Date   HGBA1C 5.7 11/20/2017   HGBA1C 5.6 11/19/2016  A/P: update a1c- weight largely stable  Cervical radiculopathy Neck has been much better in regards to cervical arthritis with going from thicker pillow to thinner pillow. Had been using CBD oil and not having to anymore.   Future Appointments  Date Time Provider [place]  11/23/2018  8:15 AM Marin Olp, MD [facility]  06/23/2019  4:00 PM [facility] HEALTH COACH [facility]   Lab/Order associations: Hyperlipidemia, unspecified hyperlipidemia type - Plan: Direct LDL  Hyperglycemia - Plan: HgB A1c  Essential hypertension - Plan: Direct LDL  Need for prophylactic vaccination and inoculation against influenza - Plan: Flu vaccine  HIGH DOSE PF  Cervical radiculopathy  Return precautions advised.  Garret Reddish, MD

## 2018-06-29 NOTE — Patient Instructions (Addendum)
Health Maintenance Due  Topic Date Due  . INFLUENZA VACCINE - thanks for doing this today ! 05/14/2018   Please stop by lab before you go

## 2018-11-18 DIAGNOSIS — L570 Actinic keratosis: Secondary | ICD-10-CM | POA: Diagnosis not present

## 2018-11-18 DIAGNOSIS — L821 Other seborrheic keratosis: Secondary | ICD-10-CM | POA: Diagnosis not present

## 2018-11-18 DIAGNOSIS — L814 Other melanin hyperpigmentation: Secondary | ICD-10-CM | POA: Diagnosis not present

## 2018-11-18 DIAGNOSIS — Z23 Encounter for immunization: Secondary | ICD-10-CM | POA: Diagnosis not present

## 2018-11-18 DIAGNOSIS — D485 Neoplasm of uncertain behavior of skin: Secondary | ICD-10-CM | POA: Diagnosis not present

## 2018-11-18 DIAGNOSIS — D225 Melanocytic nevi of trunk: Secondary | ICD-10-CM | POA: Diagnosis not present

## 2018-11-18 DIAGNOSIS — Z85828 Personal history of other malignant neoplasm of skin: Secondary | ICD-10-CM | POA: Diagnosis not present

## 2018-11-23 ENCOUNTER — Encounter: Payer: Self-pay | Admitting: Family Medicine

## 2018-11-23 ENCOUNTER — Ambulatory Visit (INDEPENDENT_AMBULATORY_CARE_PROVIDER_SITE_OTHER): Payer: Medicare HMO | Admitting: Family Medicine

## 2018-11-23 VITALS — BP 130/80 | HR 55 | Temp 97.8°F | Ht 71.0 in | Wt 171.8 lb

## 2018-11-23 DIAGNOSIS — R739 Hyperglycemia, unspecified: Secondary | ICD-10-CM

## 2018-11-23 DIAGNOSIS — E785 Hyperlipidemia, unspecified: Secondary | ICD-10-CM

## 2018-11-23 DIAGNOSIS — E042 Nontoxic multinodular goiter: Secondary | ICD-10-CM

## 2018-11-23 DIAGNOSIS — I10 Essential (primary) hypertension: Secondary | ICD-10-CM | POA: Diagnosis not present

## 2018-11-23 DIAGNOSIS — Z125 Encounter for screening for malignant neoplasm of prostate: Secondary | ICD-10-CM | POA: Diagnosis not present

## 2018-11-23 DIAGNOSIS — Z0000 Encounter for general adult medical examination without abnormal findings: Secondary | ICD-10-CM

## 2018-11-23 LAB — TSH: TSH: 0.6 u[IU]/mL (ref 0.35–4.50)

## 2018-11-23 LAB — COMPREHENSIVE METABOLIC PANEL
ALT: 16 U/L (ref 0–53)
AST: 23 U/L (ref 0–37)
Albumin: 4.6 g/dL (ref 3.5–5.2)
Alkaline Phosphatase: 49 U/L (ref 39–117)
BUN: 14 mg/dL (ref 6–23)
CHLORIDE: 103 meq/L (ref 96–112)
CO2: 30 mEq/L (ref 19–32)
Calcium: 9.8 mg/dL (ref 8.4–10.5)
Creatinine, Ser: 1.06 mg/dL (ref 0.40–1.50)
GFR: 68.78 mL/min (ref >60.00)
GLUCOSE: 104 mg/dL — AB (ref 70–99)
POTASSIUM: 4.5 meq/L (ref 3.5–5.1)
SODIUM: 138 meq/L (ref 135–145)
Total Bilirubin: 0.6 mg/dL (ref 0.2–1.2)
Total Protein: 7.2 g/dL (ref 6.0–8.3)

## 2018-11-23 LAB — CBC
HEMATOCRIT: 42.1 % (ref 39.0–52.0)
Hemoglobin: 14.4 g/dL (ref 13.0–17.0)
MCHC: 34.2 g/dL (ref 30.0–36.0)
MCV: 93.8 fl (ref 78.0–100.0)
Platelets: 209 10*3/uL (ref 150.0–400.0)
RBC: 4.49 Mil/uL (ref 4.22–5.81)
RDW: 13.3 % (ref 11.5–15.5)
WBC: 4.3 10*3/uL (ref 4.0–10.5)

## 2018-11-23 LAB — HEMOGLOBIN A1C: Hgb A1c MFr Bld: 5.6 % (ref 4.6–6.5)

## 2018-11-23 LAB — LIPID PANEL
CHOL/HDL RATIO: 4
CHOLESTEROL: 173 mg/dL (ref 0–200)
HDL: 45.9 mg/dL (ref >39.00)
LDL CALC: 117 mg/dL — AB (ref 0–99)
NONHDL: 127.49
Triglycerides: 54 mg/dL (ref 0.0–149.0)
VLDL: 10.8 mg/dL (ref 0.0–40.0)

## 2018-11-23 LAB — PSA: PSA: 1.06 ng/mL (ref 0.10–4.00)

## 2018-11-23 MED ORDER — AMLODIPINE BESYLATE 10 MG PO TABS: 10.0000 mg | ORAL_TABLET | Freq: Every day | ORAL | 3 refills | Status: DC

## 2018-11-23 NOTE — Assessment & Plan Note (Signed)
S: Mildly poorly controlled on red yeast rice, fish oil, coq10.  Also on aspirin for primary prevention- discussed evidence for aspirin for primary prevention is not strong when weighing compared to side effects above age 72- he actually has already stopped the medicine- so that's perfect  Lab Results  Component Value Date   CHOL 153 11/20/2017   HDL 40.80 11/20/2017   LDLCALC 102 (H) 11/20/2017   LDLDIRECT 109.0 06/29/2018   TRIG 51.0 11/20/2017   CHOLHDL 4 11/20/2017   A/P: Suspect stable problem-with mild poor control potentially.  Update lipid panel- he states #s actually better than when he was on cholesterol medicine years ago -may remain off aspirin

## 2018-11-23 NOTE — Progress Notes (Signed)
Phone: [phone]   Subjective:  Patient presents today for their annual physical. Chief complaint-noted.   See problem oriented charting- ROS- full  review of systems was completed and negative except for: some neck and shoulder pain but better with pillow change. No chest pain or shortness of breath. No headache or blurry vision.   The following were reviewed and entered/updated in epic: Past Medical History:  Diagnosis Date  . GERD (gastroesophageal reflux disease)   . Hyperlipidemia   . Hypertension   . THYROID NODULE 05/05/2007   Multiple nodules. Biopsy 2007 hyperplastic. No further follow up planned.     Patient Active Problem List   Diagnosis Date Noted  . Hyperglycemia 11/19/2016    Priority: Medium  . Hyperlipidemia 05/05/2007    Priority: Medium  . Essential hypertension 05/05/2007    Priority: Medium  . Former smoker 11/14/2014    Priority: Low  . History of skin cancer 11/07/2014    Priority: Low  . GERD 05/05/2007    Priority: Low  . Cervical radiculopathy 11/20/2017  . Multiple thyroid nodules 08/13/2017  . History of thyrotoxicosis 08/13/2017   Past Surgical History:  Procedure Laterality Date  . CATARACT EXTRACTION, BILATERAL     dec 6th 2018 right eye, dec 20th 2018 left eye  . THYROID SURGERY     biopsy 2007 benign    Family History  Problem Relation Age of Onset  . Heart disease Father 49       smoker  . Thyroid disease Father        goiter  . Hypertension Sister   . Asthma Sister   . Emphysema Sister   . Heart attack Brother 51       did not eat well, no medical care  . Hypertension Brother   . Heart disease Brother   . Diabetes Mother   . Stroke Mother   . Diabetes Sister   . Thyroid disease Sister   . Hypertension Sister   . Hypertension Brother     Medications- reviewed and updated Current Outpatient Medications  Medication Sig Dispense Refill  . amLODipine (NORVASC) 10 MG tablet Take 1 tablet (10 mg total) by mouth daily.  90 tablet 3  . co-enzyme Q-10 30 MG capsule Take 300 mg by mouth daily.     . fish oil-omega-3 fatty acids 1000 MG capsule Take 2 g by mouth daily.      . Multiple Vitamin (MULTIVITAMIN) tablet Take 1 tablet by mouth 3 (three) times a week.     . Red Yeast Rice Extract (RED YEAST RICE PO) Take 600 mg by mouth 4 (four) times a week.     Marland Kitchen aspirin 81 MG tablet Take 81 mg by mouth 3 (three) times a week.      No current facility-administered medications for this visit.     Allergies-reviewed and updated Allergies  Allergen Reactions  . Lisinopril     Mild cough    Social History   Social History Narrative   Married (wife patient of Dr. Regis Bill). 2 sons. 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       Hobbies: golf-runs church golf group, go see grandkids. Walk 3 miles alternating days with going to [facility]   Objective  Objective:  BP 130/80 (BP Location: Left Arm, Patient Position: Sitting, Cuff Size: Normal)  Pulse (!) 55   Temp 97.8 F (36.6 C) (Oral)   Ht 5\' 11"  (1.803 m)   Wt 171 lb 12.8 oz (77.9 kg)   SpO2 99%   BMI 23.96 kg/m  Gen: NAD, resting comfortably HEENT: Mucous membranes are moist. Oropharynx normal.  Tympanic membrane normal Neck: Noted thyromegaly CV: RRR no murmurs rubs or gallops Lungs: CTAB no crackles, wheeze, rhonchi Abdomen: soft/nontender/nondistended/normal bowel sounds. No rebound or guarding.  Healthy weight Ext: no edema Skin: warm, dry, recent biopsy scar on nose Neuro: grossly normal, moves all extremities, PERRLA Rectal deferred unless PSA trend concerning   Assessment and Plan  73 y.o. male presenting for annual physical.  Health Maintenance counseling: 1. Anticipatory guidance: Patient counseled regarding regular dental exams -q6 months, eye exams -yearly,  avoiding smoking and second  hand smoke , limiting alcohol to 2 beverages per day - 7 per week.   2. Risk factor reduction:  Advised patient of need for regular exercise and diet rich and fruits and vegetables to reduce risk of heart attack and stroke. Exercise- 2-3 days a week. Diet-balanced diet.  Wt Readings from Last 3 Encounters:  11/23/18 171 lb 12.8 oz (77.9 kg)  06/29/18 169 lb 12.8 oz (77 kg)  06/10/18 167 lb 12.8 oz (76.1 kg)  3. Immunizations/screenings/ancillary studies-  Immunization History  Administered Date(s) Administered  . Influenza Split 07/14/2012, 06/28/2013  . Influenza, High Dose Seasonal PF 06/29/2018  . Influenza-Unspecified 07/17/2014, 06/15/2015, 07/31/2016, 06/30/2017  . Pneumococcal Conjugate-13 11/07/2014  . Pneumococcal Polysaccharide-23 10/06/2012  . Tdap 01/08/2010  . Zoster 01/08/2010  . Zoster Recombinat (Shingrix) 10/26/2018   4. Prostate cancer screening- wants to screen though age 89 with PSA. Rectal deferred unless PSA trend concerning.   Lab Results  Component Value Date   PSA 1.13 11/20/2017   PSA 0.9 11/20/2016   PSA 1.11 11/07/2014   5. Colon cancer screening - 03/04/18 with Dr. Collene Mares- scheduled for 5 years- thinks he had polyps 20 years ago 63. Skin cancer screening- saw Dr. Renda Rolls last week. advised regular sunscreen use. Denies worrisome, changing, or new skin lesions.  7. never smoker  Status of chronic or acute concerns   Hypertension S: controlled on amlodipine 10 mg. Home # average 138/81 over the last year A/P: We discussed blood pressure goal of <140/90. Continue current meds: Stable today   #Hyperglycemia S: Continues healthy lifestyle choices-regular exercise, healthy eating Lab Results  Component Value Date   HGBA1C 5.7 06/29/2018  A/P: Continue efforts-update A1c  Hyperlipidemia S: Mildly poorly controlled on red yeast rice, fish oil, coq10.  Also on aspirin for primary prevention- discussed evidence for aspirin for primary prevention is not  strong when weighing compared to side effects above age 24- he actually has already stopped the medicine- so that's perfect  Lab Results  Component Value Date   CHOL 153 11/20/2017   HDL 40.80 11/20/2017   LDLCALC 102 (H) 11/20/2017   LDLDIRECT 109.0 06/29/2018   TRIG 51.0 11/20/2017   CHOLHDL 4 11/20/2017   A/P: Suspect stable problem-with mild poor control potentially.  Update lipid panel- he states #s actually better than when he was on cholesterol medicine years ago -may remain off aspirin  Other notes: 1.  Patient concerned about his thyroid-multiple thyroid nodules- last seen October 18, we will check a tsh with labs otday 2.  Continued issues with sinuses in winter months- has been better in last few weeks 3.  Waiting on skin biopsy results 4. Neck pain  is better after getting thinner pillow. Still some pain in left arm but better with this change.  5. Wants PSA screening to age 54   Future Appointments  Date Time Provider [place]  06/23/2019  4:00 PM [facility]   No follow-ups on file.  Lab/Order associations: Fasting today Essential hypertension - Plan: CBC, Comprehensive metabolic panel, Lipid panel  Hyperlipidemia, unspecified hyperlipidemia type - Plan: CBC, Comprehensive metabolic panel, Lipid panel  Hyperglycemia - Plan: Hemoglobin A1c  Multiple thyroid nodules - Plan: TSH  Screening for prostate cancer - Plan: PSA  Meds ordered this encounter  Medications  . amLODipine (NORVASC) 10 MG tablet    Sig: Take 1 tablet (10 mg total) by mouth daily.    Dispense:  90 tablet    Refill:  3    Return precautions advised.  Garret Reddish, MD

## 2018-11-23 NOTE — Patient Instructions (Addendum)
Please stop by lab before you go If you do not have mychart- we will call you about results within 5 business days of [place] receiving them.  If you have mychart- we will send your results within 3 business days of [place] receiving them.  If abnormal or we want to clarify a result, we will call or mychart you to make sure you receive the message.  If you have questions or concerns or don't hear within 5-7 days, please send [place] a message or call us.     No changes today- refilled amlodipine. You are doing great! Keep up the good work and we will see you in a year unless you need [place] sooner

## 2018-11-24 DIAGNOSIS — H524 Presbyopia: Secondary | ICD-10-CM | POA: Diagnosis not present

## 2018-11-24 DIAGNOSIS — Z961 Presence of intraocular lens: Secondary | ICD-10-CM | POA: Diagnosis not present

## 2018-11-24 DIAGNOSIS — H43813 Vitreous degeneration, bilateral: Secondary | ICD-10-CM | POA: Diagnosis not present

## 2018-11-25 ENCOUNTER — Encounter: Payer: Self-pay | Admitting: Internal Medicine

## 2018-11-25 ENCOUNTER — Ambulatory Visit: Payer: Medicare HMO | Admitting: Internal Medicine

## 2018-11-25 VITALS — BP 132/80 | HR 63 | Ht 71.0 in | Wt 172.0 lb

## 2018-11-25 DIAGNOSIS — E042 Nontoxic multinodular goiter: Secondary | ICD-10-CM

## 2018-11-25 DIAGNOSIS — Z8639 Personal history of other endocrine, nutritional and metabolic disease: Secondary | ICD-10-CM | POA: Diagnosis not present

## 2018-11-25 NOTE — Patient Instructions (Addendum)
  We will order a new thyroid U/S.  Please come back in 2 years.

## 2018-11-25 NOTE — Progress Notes (Signed)
Patient ID: Clifford Houston, male   DOB: 1946/10/09, 73 y.o.   MRN: [id number]    HPI  Clifford Houston is a 73 y.o.-year-old male, returning for follow-up for multiple thyroid nodules and h/o thyrotoxicosis.  Last visit 1 year and 3 months ago.  Patient has a long history of thyroid nodules since at least 2007, and has had an FNA of the right inf thyroid nodule (2007): Benign  Thyroid U/S (11/20/2015): Thyroid nodules have slightly increased in size. The right nodule meets criteria for biopsy.  Right inferior nodule has increased from 1.5 to 2 cm Right mid nodule has increased from 1.5 to 1.7 cm   Thyroid uptake and scan (12/28/2015): Normal uptake, 13.7% and also normal scan, without cold nodules  Dr. Loanne Drilling suggested to have RAI treatment, but patient refused and came to see me in 2018 for a second opinion.  Based on his studies and results, I did not suggest RAI treatment.  Pt denies: - feeling nodules in neck - hoarseness - dysphagia - choking - SOB with lying down  Reviewed patient's TFTs-normal, including 2 days ago: Lab Results  Component Value Date   TSH 0.60 11/23/2018   TSH 0.47 11/20/2017   TSH 0.71 08/13/2017   TSH 0.71 11/19/2016   TSH 0.53 11/15/2015   TSH 0.56 11/07/2014   TSH 0.52 11/03/2012   TSH 0.24 (L) 10/06/2012   TSH 0.40 01/01/2010   TSH 0.72 12/16/2007   FREET4 0.85 08/13/2017   FREET4 0.91 11/19/2016   FREET4 0.83 11/03/2012    Pt denies: - weight loss - heat intolerance - tremors - palpitations - anxiety - hyperdefecation - hair loss  + FH of thyroid ds.: father >> goiter; sister >> hyperthyroidism. Nephew with parathyroidectomy. ? FH of thyroid cancer in aunt in her 66s. No h/o radiation tx to head or neck.  No seaweed or kelp. No recent contrast studies. No herbal supplements. No Biotin use. No recent steroids use.   Pt also has a history of HTN, HL, skin CA, neck pain.  ROS: Constitutional: + See HPI Eyes: no blurry vision, no  xerophthalmia ENT: no sore throat, + see HPI Cardiovascular: no CP/no SOB/no palpitations/no leg swelling Respiratory: no cough/no SOB/no wheezing Gastrointestinal: no N/no V/no D/no C/no acid reflux Musculoskeletal: no muscle aches/no joint aches Skin: no rashes, no hair loss Neurological: + Tremors/no numbness/no tingling/no dizziness  I reviewed pt's medications, allergies, PMH, social hx, family hx, and changes were documented in the history of present illness. Otherwise, unchanged from my initial visit note. Stopped ASA 81.  Past Medical History:  Diagnosis Date  . GERD (gastroesophageal reflux disease)   . Hyperlipidemia   . Hypertension   . THYROID NODULE 05/05/2007   Multiple nodules. Biopsy 2007 hyperplastic. No further follow up planned.     Past Surgical History:  Procedure Laterality Date  . CATARACT EXTRACTION, BILATERAL     dec 6th 2018 right eye, dec 20th 2018 left eye  . THYROID SURGERY     biopsy 2007 benign   Social History   Social History Main Topics  . Smoking status: Former Smoker    Packs/day: 1.00    Years: 6.00    Types: Cigarettes    Quit date: 10/14/1965  . Smokeless tobacco: Never Used  . Alcohol use 0.6 oz/week    1 Standard drinks or equivalent per week  . Drug use: No   Social History Narrative   Married (wife patient of Dr. Regis Bill). 2 sons.  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       Hobbies: golf-runs church golf group, go see grandkids. Walk 3 miles alternating days with going to [facility]   Current Outpatient Medications on File Prior to Visit  Medication Sig Dispense Refill  . amLODipine (NORVASC) 10 MG tablet Take 1 tablet (10 mg total) by mouth daily. 90 tablet 3  . aspirin 81 MG tablet Take 81 mg by mouth 3 (three) times a week.     . co-enzyme Q-10 30 MG capsule Take 300 mg by  mouth daily.     . fish oil-omega-3 fatty acids 1000 MG capsule Take 2 g by mouth daily.      . Multiple Vitamin (MULTIVITAMIN) tablet Take 1 tablet by mouth 3 (three) times a week.     . Red Yeast Rice Extract (RED YEAST RICE PO) Take 600 mg by mouth 4 (four) times a week.      No current facility-administered medications on file prior to visit.    Allergies  Allergen Reactions  . Lisinopril     Mild cough   Family History  Problem Relation Age of Onset  . Heart disease Father 34       smoker  . Thyroid disease Father        goiter  . Hypertension Sister   . Asthma Sister   . Emphysema Sister   . Heart attack Brother 51       did not eat well, no medical care  . Hypertension Brother   . Heart disease Brother   . Diabetes Mother   . Stroke Mother   . Diabetes Sister   . Thyroid disease Sister   . Hypertension Sister   . Hypertension Brother     PE: BP 132/80   Pulse 63   Ht 5\' 11"  (1.803 m) Comment: measured  Wt 172 lb (78 kg)   SpO2 97%   BMI 23.99 kg/m  Wt Readings from Last 3 Encounters:  11/25/18 172 lb (78 kg)  11/23/18 171 lb 12.8 oz (77.9 kg)  06/29/18 169 lb 12.8 oz (77 kg)   Constitutional: overweight, in NAD Eyes: PERRLA, EOMI, no exophthalmos ENT: moist mucous membranes, no thyromegaly, no cervical lymphadenopathy Cardiovascular: RRR, No MRG Respiratory: CTA B Gastrointestinal: abdomen soft, NT, ND, BS+ Musculoskeletal: no deformities, strength intact in all 4 Skin: moist, warm, no rashes Neurological: + Very slight tremor with outstretched hands (chronic), DTR normal in all 4  ASSESSMENT: 1. Multiple thyroid nodules  2. History of suppressed TSH  PLAN: 1. Multiple thyroid nodules -I reviewed the report of his most recent thyroid ultrasound from 2017 and compared it with the reports from 2007, 11, and 14 thyroid ultrasound.  The dominant nodules are not very large and have been fairly stable since 2007.  At that time, the right mid nodule was  1.5 cm and the right inferior nodule was 1.9 cm (this was biopsied in 2007 with benign results).  These dimensions are not very different than the ones in 2017.  We discussed that this is a very good sign.  Also, patient does not have a thyroid cancer family history or personal history of radiation therapy to head or neck.  All of these would favor benignity.  Therefore, at last visit I advised him to return in 2 years for reevaluation.  He returns 1 year and 3 months after the previous visit. -We will repeat a thyroid ultrasound now  2. H/o Suppressed TSH -He had one mildly low TSH in the past, but otherwise normal levels -Last TSH was normal at last visit and he had 2 more TFTs it since then, both normal.  Last TSH was normal 2 days ago. -He has no signs or symptoms of thyrotoxicosis -At last visit, we discussed about conditions in which we recommend RAI treatment but with normal thyroid tests and without a clear toxic adenoma or Graves' disease on his uptake and scan, this is not indicated for him.  Orders Placed This Encounter  Procedures  . US THYROID   I will addend the results when they become available.  - time spent with the patient: 25 min, of which >50% was spent in obtaining information about his symptoms, reviewing his previous labs, evaluations, and treatments, counseling him about his conditions (please see the discussed topics above), and developing a plan to further investigate and treat them.  Reading Physician Reading Date Result Priority  Markus Daft, MD 11/27/2018     Narrative & Impression    CLINICAL DATA:  Follow-up thyroid nodules. Right inferior thyroid nodule was biopsied on 07/24/2006.  Parenchymal Echotexture: Moderately heterogenous Isthmus: 0.7 cm, previously 0.9 cm Right lobe: 7.4 x 3.3 x 2.9 cm, previously 8.0 x 3.2 x 3.1 cm Left lobe: 6.9 x 2.3 x 2.5 cm, previously 7.3 x 2.7 x 2.5 cm __________________________________________________________  Nodule #  1: Prior biopsy: No Location: Right; Mid Maximum size: 1.9 cm; Other 2 dimensions: 0.8 x 1.2 cm, previously, 1.7 x 0.9 x 1.5 cm Composition: solid/almost completely solid (2) Echogenicity: isoechoic (1) Significant change in size (>/= 20% in two dimensions and minimal increase of 2 mm): No *Given size (>/= 1.5 - 2.4 cm) and appearance, a follow-up ultrasound in 1 year should be considered based on TI-RADS criteria. _______________________________________________  Partially cystic nodule in the superior left thyroid lobe measuring up to 0.8 cm.  Previously, there was a heterogeneous nodule in the inferior posterior aspect of the right thyroid lobe. There is not a well-defined nodule in this area but rather small complex cystic collections and heterogeneous tissue. Heterogeneous area in the inferior right thyroid lobe roughly measures up to 1.8 cm and previously measured up to 2.0 cm on the prior examination.  IMPRESSION: 1. Enlarged heterogeneous thyroid tissue with scattered nodules. 2. Isoechoic nodule in the mid right thyroid lobe is stable since 2017. This nodule meets criteria for annual surveillance until there is 5 years of stability. 3. Heterogeneous nodular area in the inferior right thyroid lobe has minimally changed. 4. No new suspicious thyroid nodules.  The above is in keeping with the ACR TI-RADS recommendations - J Am Coll Radiol 2017;14:587-595.   Electronically Signed   By: Markus Daft M.D.   On: 11/27/2018 16:12    No significant change in his thyroid nodules.  We can continue to just follow him without intervention.  Philemon Kingdom, MD PhD [facility]

## 2018-11-26 ENCOUNTER — Ambulatory Visit: Payer: Medicare Other | Admitting: *Deleted

## 2018-11-27 ENCOUNTER — Ambulatory Visit
Admission: RE | Admit: 2018-11-27 | Discharge: 2018-11-27 | Disposition: A | Discharge status: Home / Self Care | Payer: Medicare HMO | Source: Ambulatory Visit | Attending: Internal Medicine | Admitting: Internal Medicine

## 2018-11-27 DIAGNOSIS — E042 Nontoxic multinodular goiter: Secondary | ICD-10-CM

## 2018-11-27 DIAGNOSIS — E041 Nontoxic single thyroid nodule: Secondary | ICD-10-CM | POA: Diagnosis not present

## 2019-03-18 ENCOUNTER — Encounter: Payer: Self-pay | Admitting: Family Medicine

## 2019-03-23 ENCOUNTER — Ambulatory Visit (INDEPENDENT_AMBULATORY_CARE_PROVIDER_SITE_OTHER): Payer: Medicare HMO

## 2019-03-23 ENCOUNTER — Other Ambulatory Visit: Payer: Self-pay

## 2019-03-23 ENCOUNTER — Ambulatory Visit: Payer: Medicare HMO | Admitting: Family Medicine

## 2019-03-23 DIAGNOSIS — Z23 Encounter for immunization: Secondary | ICD-10-CM

## 2019-03-23 NOTE — Progress Notes (Addendum)
Per orders of Dr. Yong Channel, injection of Shingrix #2, 0.5 ml given left deltoid IM by Kevan Ny, CMA Patient tolerated injection well.

## 2019-03-23 NOTE — Progress Notes (Signed)
I have reviewed and agree with note, evaluation, plan.   Khyleigh Furney, MD  

## 2019-06-22 ENCOUNTER — Encounter: Payer: Self-pay | Admitting: Family Medicine

## 2019-06-22 ENCOUNTER — Telehealth: Payer: Self-pay | Admitting: Family Medicine

## 2019-06-22 NOTE — Telephone Encounter (Signed)
We received this email about a patient concern from patient. I called and spoke to the patient about his concerns. He expressed that he was frustrated with the current phone system. I explained that we were in transition of taking those calls back in the office. We are hoping to have that completed over the next few months. He thanked me and said that it had been a hard few years of dealing with that. He would have changed care if he didn't like Dr. Yong Channel. He also wanted to know why no one had called him to verify his appointment for tomorrow. I did explain to him that we call the day before the appointment to confirm and also to prescreen any patients before coming in . He stated that he understood and expressed that he appreciated me letting him know. The patient stated that he was also concerned about a billing issue but wanted to let me know that he was working on that with billing. He stated that he was billed for a shingles vaccine and the cost was about $150 more at [facility] than it was at [facility]. We also discussed how he and his wife are taking a lot of precautions to stay safe from [place]. He thanked me for my time and I let him know that hearing from patients and giving feedback is how we improve.    [place] Oskey DOB: [birth year] [phone] [email or web address]  I called the phone number for [facility] this morning and was on hold for over 20 minutes before my call was answered and then I was speaking with basically an answering service, I still had not been connected to the office for [employer]. I have an appointment tomorrow at 4:00 for my annual wellness visit and I wanted to speak with the office to find out if my visit can be done over the phone or via Zoom or some other type meeting rather than going into the office. The person I spoke with today said they would send an email to [facility] to let them know about my call.   My complaint: I should be able  to call [facility] direct and speak with a real person in that office without listening to 2-3 minutes of an automated message and then being on hold for over 20 minutes before the call is answered. Who do I speak with to register my complaint? Name and direct number?   And I originally called the number below and reached an organization that helps people that need help paying their medical bills, this needs to be updated on the internet.

## 2019-06-23 ENCOUNTER — Ambulatory Visit (INDEPENDENT_AMBULATORY_CARE_PROVIDER_SITE_OTHER): Payer: Medicare HMO

## 2019-06-23 ENCOUNTER — Other Ambulatory Visit: Payer: Self-pay

## 2019-06-23 VITALS — BP 134/78

## 2019-06-23 DIAGNOSIS — Z0000 Encounter for general adult medical examination without abnormal findings: Secondary | ICD-10-CM | POA: Diagnosis not present

## 2019-06-23 NOTE — Progress Notes (Signed)
This visit is being conducted via phone call due to the COVID-19 pandemic. This patient has given me verbal consent via phone to conduct this visit, patient states they are participating from their home address. Some vital signs may be absent or patient reported.   Patient identification: identified by name, DOB, and current address.   Subjective:   Clifford Houston is a 73 y.o. male who presents for Medicare Annual/Subsequent preventive examination.  Review of Systems:   Cardiac Risk Factors include: advanced age (>11men, >23 women);male gender;hypertension     Objective:    Vitals: BP 134/78   There is no height or weight on file to calculate BMI.  Advanced Directives 06/23/2019 06/10/2018  Does Patient Have a Medical Advance Directive? Yes Yes  Type of Advance Directive Living will;Healthcare Power of Clifford Houston;Living will  Does patient want to make changes to medical advance directive? No - Patient declined No - Patient declined  Copy of [place] in Chart? Yes - validated most recent copy scanned in chart (See row information) No - copy requested    Tobacco Social History   Tobacco Use  Smoking Status Former Smoker  . Packs/day: 1.00  . Years: 6.00  . Pack years: 6.00  . Types: Cigarettes  . Quit date: 10/14/1965  . Years since quitting: 53.7  Smokeless Tobacco Never Used     Counseling given: Not Answered   Clinical Intake:  Pre-visit preparation completed: Yes  Pain : 0-10 Pain Score: 2  Pain Type: Acute pain Pain Location: Hip Pain Orientation: Right  Diabetes: No  How often do you need to have someone help you when you read instructions, pamphlets, or other written materials from your doctor or pharmacy?: 1 - Never  Interpreter Needed?: No  Information entered by :: Denman George LPN  Past Medical History:  Diagnosis Date  . GERD (gastroesophageal reflux disease)   . Hyperlipidemia   . Hypertension    . THYROID NODULE 05/05/2007   Multiple nodules. Biopsy 2007 hyperplastic. No further follow up planned.     Past Surgical History:  Procedure Laterality Date  . CATARACT EXTRACTION, BILATERAL     dec 6th 2018 right eye, dec 20th 2018 left eye  . THYROID SURGERY     biopsy 2007 benign   Family History  Problem Relation Age of Onset  . Heart disease Father 12       smoker  . Thyroid disease Father        goiter  . Hypertension Sister   . Asthma Sister   . Emphysema Sister   . Heart attack Brother 51       did not eat well, no medical care  . Hypertension Brother   . Heart disease Brother   . Diabetes Mother   . Stroke Mother   . Diabetes Sister   . Thyroid disease Sister   . Hypertension Sister   . Hypertension Brother    Social History   Socioeconomic History  . Marital status: Married    Spouse name: Not on file  . Number of children: Not on file  . Years of education: Not on file  . Highest education level: Not on file  Occupational History  . Not on file  Social Needs  . Financial resource strain: Not on file  . Food insecurity    Worry: Not on file    Inability: Not on file  . Transportation needs    Medical:  Not on file    Non-medical: Not on file  Tobacco Use  . Smoking status: Former Smoker    Packs/day: 1.00    Years: 6.00    Pack years: 6.00    Types: Cigarettes    Quit date: 10/14/1965    Years since quitting: 53.7  . Smokeless tobacco: Never Used  Substance and Sexual Activity  . Alcohol use: Yes    Alcohol/week: 1.0 standard drinks    Types: 1 Standard drinks or equivalent per week  . Drug use: No  . Sexual activity: Not on file  Lifestyle  . Physical activity    Days per week: Not on file    Minutes per session: Not on file  . Stress: Not on file  Relationships  . Social Herbalist on phone: Not on file    Gets together: Not on file    Attends religious service: Not on file    Active member of club or organization: Not on  file    Attends meetings of clubs or organizations: Not on file    Relationship status: Not on file  Other Topics Concern  . Not on file  Social History Narrative   Married (wife patient of Dr. Regis Bill). 2 sons. 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       Hobbies: golf-runs church golf group, go see grandkids. Walk 3 miles alternating days with going to [facility]    Outpatient Encounter Medications as of 06/23/2019  Medication Sig  . amLODipine (NORVASC) 10 MG tablet Take 1 tablet (10 mg total) by mouth daily.  Marland Kitchen co-enzyme Q-10 30 MG capsule Take 300 mg by mouth daily.   . fish oil-omega-3 fatty acids 1000 MG capsule Take 2 g by mouth daily.    . Multiple Vitamin (MULTIVITAMIN) tablet Take 1 tablet by mouth 3 (three) times a week.   . Red Yeast Rice Extract (RED YEAST RICE PO) Take 600 mg by mouth 4 (four) times a week.    No facility-administered encounter medications on file as of 06/23/2019.     Activities of Daily Living In your present state of health, do you have any difficulty performing the following activities: 06/23/2019  Hearing? N  Vision? N  Difficulty concentrating or making decisions? N  Walking or climbing stairs? N  Dressing or bathing? N  Doing errands, shopping? N  Preparing Food and eating ? N  Using the Toilet? N  In the past six months, have you accidently leaked urine? N  Do you have problems with loss of bowel control? N  Managing your Medications? N  Managing your Finances? N  Housekeeping or managing your Housekeeping? N  Some recent data might be hidden    Patient Care Team: Marin Olp, MD as PCP - General (Family Medicine) Juanita Craver, MD as Attending Physician (Gastroenterology)   Assessment:   This is a routine wellness examination for [name].  Exercise Activities and Dietary  recommendations Current Exercise Habits: Home exercise routine, Type of exercise: walking, Time (Minutes): 45, Frequency (Times/Week): 5, Weekly Exercise (Minutes/Week): 225, Intensity: Mild  Goals    . Increase physical activity     Maintain active life style working out at [employer] and drinking water       Fall Risk Fall Risk  06/23/2019 06/10/2018 11/20/2017 11/19/2016 11/15/2015  Falls  in the past year? 0 No No No No  Number falls in past yr: 0 - - - -  Injury with Fall? 0 - - - -  Follow up Falls evaluation completed;Education provided - - - -   Is the patient's home free of loose throw rugs in walkways, pet beds, electrical cords, etc?   yes      Grab bars in the bathroom? yes      Handrails on the stairs?   yes      Adequate lighting?   yes  Depression Screen PHQ 2/9 Scores 06/23/2019 06/10/2018 11/20/2017 11/19/2016  PHQ - 2 Score 0 0 0 0    Cognitive Function- no cognitive concerns at this time     Alert? Yes         Normal Appearance? N/a  Oriented to person? Yes           Place? Yes  Time? Yes  Recall of three objects? Yes  Can perform simple calculations? Yes  Displays appropriate judgment? Yes  Can read the correct time from a watch face? Yes          Immunization History  Administered Date(s) Administered  . Influenza Split 07/14/2012, 06/28/2013  . Influenza, High Dose Seasonal PF 06/29/2018  . Influenza-Unspecified 07/17/2014, 06/15/2015, 07/31/2016, 06/30/2017  . Pneumococcal Conjugate-13 11/07/2014  . Pneumococcal Polysaccharide-23 10/06/2012  . Tdap 01/08/2010  . Zoster 01/08/2010  . Zoster Recombinat (Shingrix) 10/26/2018, 03/23/2019    Qualifies for Shingles Vaccine? Completed   Screening Tests Health Maintenance  Topic Date Due  . INFLUENZA VACCINE  05/15/2019  . TETANUS/TDAP  01/09/2020  . COLONOSCOPY  03/05/2023  . PNA vac Low Risk Adult  Completed  . Hepatitis C Screening  Addressed   Cancer Screenings: Lung: Low Dose CT Chest recommended if Age  34-80 years, 30 pack-year currently smoking OR have quit w/in 15years. Patient does not qualify. Colorectal: completed 03/04/18 with Dr. Collene Mares        Plan:    I have personally reviewed and addressed the Medicare Annual Wellness questionnaire and have noted the following in the patient's chart:  A. Medical and social history B. Use of alcohol, tobacco or illicit drugs  C. Current medications and supplements D. Functional ability and status E.  Nutritional status F.  Physical activity G. Advance directives H. List of other physicians I.  Hospitalizations, surgeries, and ER visits in previous 12 months J.  [place] such as hearing and vision if needed, cognitive and depression L. Referrals, records requested, and appointments- none   In addition, I have reviewed and discussed with patient certain preventive protocols, quality metrics, and best practice recommendations. A written personalized care plan for preventive services as well as general preventive health recommendations were provided to patient.   Signed,  Denman George, LPN  Nurse Health Advisor   Nurse Notes: no additional

## 2019-06-23 NOTE — Progress Notes (Signed)
I have reviewed and agree with note, evaluation, plan.   Stephen Hunter, MD  

## 2019-06-23 NOTE — Patient Instructions (Addendum)
[name] , Thank you for taking time to come for your Medicare Wellness Visit. I appreciate your ongoing commitment to your health goals. Please review the following plan we discussed and let me know if I can assist you in the future.   Screening recommendations/referrals: Colorectal Screening: completed 03/04/18 with Dr. Collene Mares   Vision and Dental Exams: Recommended annual ophthalmology exams for early detection of glaucoma and other disorders of the eye Recommended annual dental exams for proper oral hygiene   Vaccinations: Influenza vaccine:  recommended this fall either at PCP office or through your local pharmacy  Pneumococcal vaccine: up to date; last 11/07/14 Tdap vaccine: up to date; last 01/08/10 Shingles vaccine: completed  Advanced directives:  We have received a copy of your POA (Power of [place]) and/or Living Will. These documents can be located in your chart.  Goals: Recommend to drink at least 6-8 8oz glasses of water per day and continue to exercise for at least 150 minutes per week.  Next appointment: Please schedule your Annual Wellness Visit with your Nurse Health Advisor in one year.  Preventive Care 42 Years and Older, Male Preventive care refers to lifestyle choices and visits with your health care provider that can promote health and wellness. What does preventive care include?  A yearly physical exam. This is also called an annual well check.  Dental exams once or twice a year.  Routine eye exams. Ask your health care provider how often you should have your eyes checked.  Personal lifestyle choices, including:  Daily care of your teeth and gums.  Regular physical activity.  Eating a healthy diet.  Avoiding tobacco and drug use.  Limiting alcohol use.  Practicing safe sex.  Taking low doses of aspirin every day if recommended by your health care provider..  Taking vitamin and mineral supplements as recommended by your health care provider. What  happens during an annual well check? The services and screenings done by your health care provider during your annual well check will depend on your age, overall health, lifestyle risk factors, and family history of disease. Counseling  Your health care provider may ask you questions about your:  Alcohol use.  Tobacco use.  Drug use.  Emotional well-being.  Home and relationship well-being.  Sexual activity.  Eating habits.  History of falls.  Memory and ability to understand (cognition).  Work and work Statistician. Screening  You may have the following tests or measurements:  Height, weight, and BMI.  Blood pressure.  Lipid and cholesterol levels. These may be checked every 5 years, or more frequently if you are over 3 years old.  Skin check.  Lung cancer screening. You may have this screening every year starting at age 92 if you have a 30-pack-year history of smoking and currently smoke or have quit within the past 15 years.  Fecal occult blood test (FOBT) of the stool. You may have this test every year starting at age 84.  Flexible sigmoidoscopy or colonoscopy. You may have a sigmoidoscopy every 5 years or a colonoscopy every 10 years starting at age 58.  Prostate cancer screening. Recommendations will vary depending on your family history and other risks.  Hepatitis C blood test.  Hepatitis B blood test.  Sexually transmitted disease (STD) testing.  Diabetes screening. This is done by checking your blood sugar (glucose) after you have not eaten for a while (fasting). You may have this done every 1-3 years.  Abdominal aortic aneurysm (AAA) screening. You may need this  if you are a current or former smoker.  Osteoporosis. You may be screened starting at age 59 if you are at high risk. Talk with your health care provider about your test results, treatment options, and if necessary, the need for more tests. Vaccines  Your health care provider may recommend  certain vaccines, such as:  Influenza vaccine. This is recommended every year.  Tetanus, diphtheria, and acellular pertussis (Tdap, Td) vaccine. You may need a Td booster every 10 years.  Zoster vaccine. You may need this after age 82.  Pneumococcal 13-valent conjugate (PCV13) vaccine. One dose is recommended after age 84.  Pneumococcal polysaccharide (PPSV23) vaccine. One dose is recommended after age 56. Talk to your health care provider about which screenings and vaccines you need and how often you need them. This information is not intended to replace advice given to you by your health care provider. Make sure you discuss any questions you have with your health care provider. Document Released: 10/27/2015 Document Revised: 06/19/2016 Document Reviewed: 08/01/2015 Elsevier Interactive Patient Education  2017 [place] Prevention in the Home Falls can cause injuries. They can happen to people of all ages. There are many things you can do to make your home safe and to help prevent falls. What can I do on the outside of my home?  Regularly fix the edges of walkways and driveways and fix any cracks.  Remove anything that might make you trip as you walk through a door, such as a raised step or threshold.  Trim any bushes or trees on the path to your home.  Use bright outdoor lighting.  Clear any walking paths of anything that might make someone trip, such as rocks or tools.  Regularly check to see if handrails are loose or broken. Make sure that both sides of any steps have handrails.  Any raised decks and porches should have guardrails on the edges.  Have any leaves, snow, or ice cleared regularly.  Use sand or salt on walking paths during winter.  Clean up any spills in your garage right away. This includes oil or grease spills. What can I do in the bathroom?  Use night lights.  Install grab bars by the toilet and in the tub and shower. Do not use towel bars as  grab bars.  Use non-skid mats or decals in the tub or shower.  If you need to sit down in the shower, use a plastic, non-slip stool.  Keep the floor dry. Clean up any water that spills on the floor as soon as it happens.  Remove soap buildup in the tub or shower regularly.  Attach bath mats securely with double-sided non-slip rug tape.  Do not have throw rugs and other things on the floor that can make you trip. What can I do in the bedroom?  Use night lights.  Make sure that you have a light by your bed that is easy to reach.  Do not use any sheets or blankets that are too big for your bed. They should not hang down onto the floor.  Have a firm chair that has side arms. You can use this for support while you get dressed.  Do not have throw rugs and other things on the floor that can make you trip. What can I do in the kitchen?  Clean up any spills right away.  Avoid walking on wet floors.  Keep items that you use a lot in easy-to-reach places.  If you need  to reach something above you, use a strong step stool that has a grab bar.  Keep electrical cords out of the way.  Do not use floor polish or wax that makes floors slippery. If you must use wax, use non-skid floor wax.  Do not have throw rugs and other things on the floor that can make you trip. What can I do with my stairs?  Do not leave any items on the stairs.  Make sure that there are handrails on both sides of the stairs and use them. Fix handrails that are broken or loose. Make sure that handrails are as long as the stairways.  Check any carpeting to make sure that it is firmly attached to the stairs. Fix any carpet that is loose or worn.  Avoid having throw rugs at the top or bottom of the stairs. If you do have throw rugs, attach them to the floor with carpet tape.  Make sure that you have a light switch at the top of the stairs and the bottom of the stairs. If you do not have them, ask someone to add them  for you. What else can I do to help prevent falls?  Wear shoes that:  Do not have high heels.  Have rubber bottoms.  Are comfortable and fit you well.  Are closed at the toe. Do not wear sandals.  If you use a stepladder:  Make sure that it is fully opened. Do not climb a closed stepladder.  Make sure that both sides of the stepladder are locked into place.  Ask someone to hold it for you, if possible.  Clearly mark and make sure that you can see:  Any grab bars or handrails.  First and last steps.  Where the edge of each step is.  Use tools that help you move around (mobility aids) if they are needed. These include:  Canes.  Walkers.  Scooters.  Crutches.  Turn on the lights when you go into a dark area. Replace any light bulbs as soon as they burn out.  Set up your furniture so you have a clear path. Avoid moving your furniture around.  If any of your floors are uneven, fix them.  If there are any pets around you, be aware of where they are.  Review your medicines with your doctor. Some medicines can make you feel dizzy. This can increase your chance of falling. Ask your doctor what other things that you can do to help prevent falls. This information is not intended to replace advice given to you by your health care provider. Make sure you discuss any questions you have with your health care provider. Document Released: 07/27/2009 Document Revised: 03/07/2016 Document Reviewed: 11/04/2014 Elsevier Interactive Patient Education  2017 [employer].

## 2019-07-02 NOTE — Telephone Encounter (Signed)
Remo Lipps called in from [employer], stating he has received a request for a shingrix vaccine reimbursement for the patient. They are a tad confused as they are seeing 2 dates this was done, one in July and one in September. Please advise and call back to clarify which is the correct date at [phone].

## 2019-07-05 NOTE — Telephone Encounter (Signed)
Per pt chart, Shingrix was done 10/26/18 and 03/23/19.

## 2019-07-06 NOTE — Telephone Encounter (Signed)
Called, message received x 3, call cannot be completed at dialed.

## 2019-07-08 NOTE — Telephone Encounter (Signed)
Attempted to call again, number provided is incorrect.

## 2019-07-09 ENCOUNTER — Encounter: Payer: Self-pay | Admitting: Family Medicine

## 2019-07-14 DIAGNOSIS — R69 Illness, unspecified: Secondary | ICD-10-CM | POA: Diagnosis not present

## 2019-07-29 DIAGNOSIS — R69 Illness, unspecified: Secondary | ICD-10-CM | POA: Diagnosis not present

## 2019-11-09 ENCOUNTER — Ambulatory Visit: Payer: Medicare HMO

## 2019-11-12 ENCOUNTER — Encounter: Payer: Self-pay | Admitting: Family Medicine

## 2019-11-20 ENCOUNTER — Ambulatory Visit: Payer: Medicare HMO

## 2019-11-29 ENCOUNTER — Encounter: Payer: Medicare HMO | Admitting: Family Medicine

## 2019-11-29 ENCOUNTER — Encounter: Payer: Self-pay | Admitting: Family Medicine

## 2019-11-30 ENCOUNTER — Other Ambulatory Visit: Payer: Self-pay

## 2019-11-30 MED ORDER — AMLODIPINE BESYLATE 10 MG PO TABS: 10.0000 mg | ORAL_TABLET | Freq: Every day | ORAL | 3 refills | Status: DC

## 2020-01-05 ENCOUNTER — Encounter: Payer: Self-pay | Admitting: Family Medicine

## 2020-01-07 DIAGNOSIS — Z961 Presence of intraocular lens: Secondary | ICD-10-CM | POA: Diagnosis not present

## 2020-01-12 DIAGNOSIS — L814 Other melanin hyperpigmentation: Secondary | ICD-10-CM | POA: Diagnosis not present

## 2020-01-12 DIAGNOSIS — L578 Other skin changes due to chronic exposure to nonionizing radiation: Secondary | ICD-10-CM | POA: Diagnosis not present

## 2020-01-12 DIAGNOSIS — Z23 Encounter for immunization: Secondary | ICD-10-CM | POA: Diagnosis not present

## 2020-01-12 DIAGNOSIS — I872 Venous insufficiency (chronic) (peripheral): Secondary | ICD-10-CM | POA: Diagnosis not present

## 2020-01-12 DIAGNOSIS — L821 Other seborrheic keratosis: Secondary | ICD-10-CM | POA: Diagnosis not present

## 2020-01-12 DIAGNOSIS — Z85828 Personal history of other malignant neoplasm of skin: Secondary | ICD-10-CM | POA: Diagnosis not present

## 2020-01-12 DIAGNOSIS — D225 Melanocytic nevi of trunk: Secondary | ICD-10-CM | POA: Diagnosis not present

## 2020-01-28 ENCOUNTER — Ambulatory Visit (INDEPENDENT_AMBULATORY_CARE_PROVIDER_SITE_OTHER): Payer: Medicare HMO | Admitting: Family Medicine

## 2020-01-28 ENCOUNTER — Encounter: Payer: Self-pay | Admitting: Family Medicine

## 2020-01-28 ENCOUNTER — Other Ambulatory Visit: Payer: Self-pay

## 2020-01-28 VITALS — BP 138/80 | HR 95 | Temp 98.6°F | Ht 71.0 in | Wt 168.0 lb

## 2020-01-28 DIAGNOSIS — Z8639 Personal history of other endocrine, nutritional and metabolic disease: Secondary | ICD-10-CM | POA: Diagnosis not present

## 2020-01-28 DIAGNOSIS — Z125 Encounter for screening for malignant neoplasm of prostate: Secondary | ICD-10-CM

## 2020-01-28 DIAGNOSIS — Z0000 Encounter for general adult medical examination without abnormal findings: Secondary | ICD-10-CM

## 2020-01-28 DIAGNOSIS — E042 Nontoxic multinodular goiter: Secondary | ICD-10-CM

## 2020-01-28 DIAGNOSIS — I10 Essential (primary) hypertension: Secondary | ICD-10-CM | POA: Diagnosis not present

## 2020-01-28 DIAGNOSIS — R739 Hyperglycemia, unspecified: Secondary | ICD-10-CM | POA: Diagnosis not present

## 2020-01-28 DIAGNOSIS — E785 Hyperlipidemia, unspecified: Secondary | ICD-10-CM

## 2020-01-28 LAB — LIPID PANEL
Cholesterol: 178 mg/dL (ref 0–200)
HDL: 47.5 mg/dL (ref >39.00)
LDL Cholesterol: 117 mg/dL — ABNORMAL HIGH (ref 0–99)
NonHDL: 130.63
Total CHOL/HDL Ratio: 4
Triglycerides: 70 mg/dL (ref 0.0–149.0)
VLDL: 14 mg/dL (ref 0.0–40.0)

## 2020-01-28 LAB — CBC WITH DIFFERENTIAL/PLATELET
Basophils Absolute: 0.1 10*3/uL (ref 0.0–0.1)
Basophils Relative: 1 % (ref 0.0–3.0)
Eosinophils Absolute: 0.6 10*3/uL (ref 0.0–0.7)
Eosinophils Relative: 10.9 % — ABNORMAL HIGH (ref 0.0–5.0)
HCT: 41.8 % (ref 39.0–52.0)
Hemoglobin: 14.5 g/dL (ref 13.0–17.0)
Lymphocytes Relative: 26.9 % (ref 12.0–46.0)
Lymphs Abs: 1.4 10*3/uL (ref 0.7–4.0)
MCHC: 34.6 g/dL (ref 30.0–36.0)
MCV: 93.9 fl (ref 78.0–100.0)
Monocytes Absolute: 0.4 10*3/uL (ref 0.1–1.0)
Monocytes Relative: 7.7 % (ref 3.0–12.0)
Neutro Abs: 2.7 10*3/uL (ref 1.4–7.7)
Neutrophils Relative %: 53.5 % (ref 43.0–77.0)
Platelets: 204 10*3/uL (ref 150.0–400.0)
RBC: 4.45 Mil/uL (ref 4.22–5.81)
RDW: 13.4 % (ref 11.5–15.5)
WBC: 5.1 10*3/uL (ref 4.0–10.5)

## 2020-01-28 LAB — TSH: TSH: 0.74 u[IU]/mL (ref 0.35–4.50)

## 2020-01-28 LAB — COMPREHENSIVE METABOLIC PANEL
ALT: 16 U/L (ref 0–53)
AST: 24 U/L (ref 0–37)
Albumin: 4.8 g/dL (ref 3.5–5.2)
Alkaline Phosphatase: 53 U/L (ref 39–117)
BUN: 14 mg/dL (ref 6–23)
CO2: 28 mEq/L (ref 19–32)
Calcium: 9.9 mg/dL (ref 8.4–10.5)
Chloride: 103 mEq/L (ref 96–112)
Creatinine, Ser: 1.12 mg/dL (ref 0.40–1.50)
GFR: 64.33 mL/min (ref >60.00)
Glucose, Bld: 102 mg/dL — ABNORMAL HIGH (ref 70–99)
Potassium: 4.6 mEq/L (ref 3.5–5.1)
Sodium: 140 mEq/L (ref 135–145)
Total Bilirubin: 0.6 mg/dL (ref 0.2–1.2)
Total Protein: 7.6 g/dL (ref 6.0–8.3)

## 2020-01-28 LAB — HEMOGLOBIN A1C: Hgb A1c MFr Bld: 5.6 % (ref 4.6–6.5)

## 2020-01-28 LAB — T4, FREE: Free T4: 0.86 ng/dL (ref 0.60–1.60)

## 2020-01-28 LAB — T3, FREE: T3, Free: 3.5 pg/mL (ref 2.3–4.2)

## 2020-01-28 LAB — PSA: PSA: 1.28 ng/mL (ref 0.10–4.00)

## 2020-01-28 MED ORDER — AMLODIPINE BESYLATE 10 MG PO TABS: 10.0000 mg | ORAL_TABLET | Freq: Every day | ORAL | 3 refills | Status: DC

## 2020-01-28 NOTE — Patient Instructions (Addendum)
Health Maintenance Due  Topic Date Due  . Samul Dada will get at pharmacy  01/09/2020   Please stop by lab before you go If you do not have mychart- we will call you about results within 5 business days of [place] receiving them.  If you have mychart- we will send your results within 3 business days of [place] receiving them.  If abnormal or we want to clarify a result, we will call or mychart you to make sure you receive the message.  If you have questions or concerns or don't hear within 5 business days, please send [place] a message or call us.   Recommended follow up: Return in about 1 year (around 01/27/2021) for physical or sooner if needed. as long as BP <140/90 at home

## 2020-01-28 NOTE — Progress Notes (Signed)
Phone: [phone]   Subjective:  Patient presents today for their annual physical. Chief complaint-noted.   See problem oriented charting- Review of Systems  Constitutional: Negative for chills and fever.  HENT: Negative for ear pain, hearing loss and tinnitus.   Eyes: Negative for blurred vision, double vision and photophobia.  Respiratory: Negative for cough, shortness of breath and wheezing.   Cardiovascular: Negative for chest pain, palpitations and leg swelling.  Gastrointestinal: Negative for constipation, diarrhea, heartburn, melena, nausea and vomiting.  Genitourinary: Negative for dysuria, frequency and urgency.       Has times he does not feel like able to empty bladder all the time   Musculoskeletal: Negative for back pain, joint pain and neck pain.  Skin: Positive for itching. Negative for rash.       B/l on ankles but followed by derm   Neurological: Negative for dizziness, seizures, weakness and headaches.  Endo/Heme/Allergies: Does not bruise/bleed easily.  Psychiatric/Behavioral: Negative for depression, hallucinations, memory loss and suicidal ideas. The patient does not have insomnia.    The following were reviewed and entered/updated in epic: Past Medical History:  Diagnosis Date  . GERD (gastroesophageal reflux disease)   . Hyperlipidemia   . Hypertension   . THYROID NODULE 05/05/2007   Multiple nodules. Biopsy 2007 hyperplastic. No further follow up planned.     Patient Active Problem List   Diagnosis Date Noted  . Hyperglycemia 11/19/2016    Priority: Medium  . Hyperlipidemia 05/05/2007    Priority: Medium  . Essential hypertension 05/05/2007    Priority: Medium  . Former smoker 11/14/2014    Priority: Low  . History of skin cancer 11/07/2014    Priority: Low  . GERD 05/05/2007    Priority: Low  . Cervical radiculopathy 11/20/2017  . Multiple thyroid nodules 08/13/2017  . History of thyrotoxicosis 08/13/2017   Past Surgical History:    Procedure Laterality Date  . CATARACT EXTRACTION, BILATERAL     dec 6th 2018 right eye, dec 20th 2018 left eye  . THYROID SURGERY     biopsy 2007 benign    Family History  Problem Relation Age of Onset  . Heart disease Father 26       smoker  . Thyroid disease Father        goiter  . Hypertension Sister   . Asthma Sister   . Emphysema Sister   . Heart attack Brother 51       did not eat well, no medical care  . Hypertension Brother   . Heart disease Brother   . Diabetes Mother   . Stroke Mother   . Diabetes Sister   . Thyroid disease Sister   . Hypertension Sister   . Hypertension Brother     Medications- reviewed and updated Current Outpatient Medications  Medication Sig Dispense Refill  . amLODipine (NORVASC) 10 MG tablet Take 1 tablet (10 mg total) by mouth daily. 90 tablet 3  . co-enzyme Q-10 30 MG capsule Take 300 mg by mouth daily.     . fish oil-omega-3 fatty acids 1000 MG capsule Take 2 g by mouth daily.      . Multiple Vitamin (MULTIVITAMIN) tablet Take 1 tablet by mouth 3 (three) times a week.     . Red Yeast Rice Extract (RED YEAST RICE PO) Take 600 mg by mouth 4 (four) times a week.      No current facility-administered medications for this visit.    Allergies-reviewed and updated Allergies  Allergen Reactions  .  Lisinopril     Mild cough    Social History   Social History Narrative   Married (wife patient of Dr. Regis Bill). 2 sons. 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       Hobbies: golf-runs church golf group, go see grandkids. Walk 3 miles alternating days with going to [facility]   Objective  Objective:  BP 138/80   Pulse 95   Temp 98.6 F (37 C) (Temporal)   Ht 5\' 11"  (1.803 m)   Wt 168 lb (76.2 kg)   SpO2 98%   BMI 23.43 kg/m  Gen: NAD, resting comfortably HEENT: Mask not  removed due to covid 19. TM normal. Bridge of nose normal. Eyelids normal.  Neck: no thyromegaly or cervical lymphadenopathy  CV: RRR no murmurs rubs or gallops Lungs: CTAB no crackles, wheeze, rhonchi Abdomen: soft/nontender/nondistended/normal bowel sounds. No rebound or guarding.  Ext: no edema Skin: warm, dry Neuro: grossly normal, moves all extremities, PERRLA    Assessment and Plan  74 y.o. male presenting for annual physical.  Health Maintenance counseling: 1. Anticipatory guidance: Patient counseled regarding regular dental exams q6 months next appointment Monday , eye exams -yearly had one recently ,  avoiding smoking and second hand smoke , limiting alcohol to 2 beverages per day - Does have a beer a day.    2. Risk factor reduction:  Advised patient of need for regular exercise and diet rich and fruits and vegetables to reduce risk of heart attack and stroke. Exercise-  does stretches three times a week. Has not been walking for last 6 weeks due to talking care of family. They are all improving so he will get back to walking 2.2 miles a day soon- felt better with this than even when he used to go to [facility] Diet- Has healthy diet. Plant based.  Wt Readings from Last 3 Encounters:  01/28/20 168 lb (76.2 kg)  11/25/18 172 lb (78 kg)  11/23/18 171 lb 12.8 oz (77.9 kg)  3. Immunizations/screenings/ancillary studies- up to date on vaccinations  other than Tdap and we discussed getting this at his pharmacy Immunization History  Administered Date(s) Administered  . Influenza Split 07/14/2012, 06/28/2013  . Influenza Whole 07/14/2019  . Influenza, High Dose Seasonal PF 06/29/2018  . Influenza-Unspecified 07/17/2014, 06/15/2015, 07/31/2016, 06/30/2017  . PFIZER SARS-COV-2 Vaccination 11/11/2019, 12/09/2019  . Pneumococcal Conjugate-13 11/07/2014  . Pneumococcal Polysaccharide-23 10/06/2012  . Tdap 01/08/2010  . Zoster 01/08/2010  . Zoster Recombinat (Shingrix) 10/26/2018, 03/23/2019   4. Prostate cancer screening- - wants to screen through 76 at least with PSA.  Lab Results  Component Value Date   PSA 1.06 11/23/2018   PSA 1.13 11/20/2017   PSA 0.9 11/20/2016   5. Colon cancer screening -  03/04/18 with Dr. Collene Mares with 5 year repeat scheduled- thinks he had polyps over 20 years ago.  6. Skin cancer screening- follows with Dr. Renda Rolls. advised regular sunscreen use. Denies worrisome, changing, or new skin lesions.  7. Never smoker  Status of chronic or acute concerns   #caring for father in law who is 43- now in friends home after a fall.   #hypertension S: compliant with amlodipine 5 mg. Home #s- on average 138/82 and 134/80 on repeat. Has brought reading in for review in office. . Does not add salt to food. Tries to maintain a  heart healthy low salt diet.  A/P: high normal blood pressure but still within range- continue current meds   #hyperlipidemia S: compliant with red yeast rice 600 mg 4 times a week. 10 year ascvd risk score of 26 %. Also does fish oil.  Lab Results  Component Value Date   CHOL 173 11/23/2018   HDL 45.90 11/23/2018   LDLCALC 117 (H) 11/23/2018   LDLDIRECT 109.0 06/29/2018   TRIG 54.0 11/23/2018   CHOLHDL 4 11/23/2018  A/P: we will update lipids. We did briefly discuss coronary CT- he is going to look at #s and think things over and let me know -gave nutritionfacts.org website to look over for plant based nutrition    # Hyperglycemia/insulin resistance/prediabetes S: Peak A1c of 5.7 in 2019 Exercise and diet-  See healthy lifestyle section above A/P: we will update a1c with labs   % Thyroid nodule/History of thyrotoxicosis -patient follows with Dr. Norva Riffle with thyroid nodules since at least 2007.Marland Kitchen  He has had fine-needle aspiration in 2017.  Thyroid uptake and scan in 2017 with normal uptake.  There was some consideration for radioactive iodine treatment but ultimately patient and physician opted against this.  He has done well  since that time.  - We will update a TSH, t3, t4 as well today  #sinus issues in winter months - he managed through this. Still having some issues in early pollen season but settling down in last few days  # neck pain- better with thinner pillow last year. Not an issue at this time  Recommended follow up:  1 year physical  Lab/Order associations: fasting   ICD-10-CM   1. Preventative health care  Z00.00 TSH    CBC with Differential/Platelet    Comprehensive metabolic panel    Lipid panel    Hemoglobin A1c    PSA  2. Essential hypertension  I10   3. Hyperlipidemia, unspecified hyperlipidemia type  E78.5 CBC with Differential/Platelet    Comprehensive metabolic panel    Lipid panel  4. Hyperglycemia  R73.9 Hemoglobin A1c  5. History of thyrotoxicosis  Z86.39 TSH    T4, free    T3, free  6. Screening for prostate cancer  Z12.5 PSA  7. Multiple thyroid nodules  E04.2 TSH    T4, free    T3, free    No orders of the defined types were placed in this encounter.   Return precautions advised.  Garret Reddish, MD

## 2020-01-31 DIAGNOSIS — R69 Illness, unspecified: Secondary | ICD-10-CM | POA: Diagnosis not present

## 2020-02-02 ENCOUNTER — Encounter: Payer: Self-pay | Admitting: Internal Medicine

## 2020-02-02 ENCOUNTER — Telehealth: Payer: Self-pay | Admitting: Internal Medicine

## 2020-02-02 NOTE — Telephone Encounter (Signed)
I saw them and send him a message about them

## 2020-02-02 NOTE — Telephone Encounter (Signed)
Patient called because he says he is unable to send Dr Cruzita Lederer a mychart message and he wanted to confirm that she had seen his lab results. Ph# [phone]

## 2020-02-02 NOTE — Telephone Encounter (Signed)
Patient had labs at PCP and they are in [place]. Please advise.

## 2020-02-04 NOTE — Telephone Encounter (Signed)
I never had to do that... But yes, I just did.

## 2020-02-04 NOTE — Telephone Encounter (Signed)
Nurse, children's called asking if Dr Cruzita Lederer could add herself to the [facility] for this patient's Mychart.  Tye [place] [employer] [phone]

## 2020-02-08 ENCOUNTER — Other Ambulatory Visit: Payer: Self-pay

## 2020-02-08 ENCOUNTER — Ambulatory Visit (INDEPENDENT_AMBULATORY_CARE_PROVIDER_SITE_OTHER)
Admission: RE | Admit: 2020-02-08 | Discharge: 2020-02-08 | Disposition: A | Discharge status: Home / Self Care | Payer: Self-pay | Source: Ambulatory Visit | Attending: Family Medicine | Admitting: Family Medicine

## 2020-02-08 DIAGNOSIS — E785 Hyperlipidemia, unspecified: Secondary | ICD-10-CM

## 2020-02-10 ENCOUNTER — Encounter: Payer: Self-pay | Admitting: Family Medicine

## 2020-02-10 MED ORDER — ROSUVASTATIN CALCIUM 5 MG PO TABS: 5.0000 mg | ORAL_TABLET | Freq: Every day | ORAL | 3 refills | Status: DC

## 2020-02-25 ENCOUNTER — Encounter: Payer: Self-pay | Admitting: Family Medicine

## 2020-04-04 ENCOUNTER — Encounter: Payer: Self-pay | Admitting: Family Medicine

## 2020-04-04 NOTE — Telephone Encounter (Signed)
Please advise. Message sent to patient to let them know you are out of office.

## 2020-04-18 DIAGNOSIS — Z20822 Contact with and (suspected) exposure to covid-19: Secondary | ICD-10-CM | POA: Diagnosis not present

## 2020-05-08 ENCOUNTER — Telehealth: Payer: Self-pay | Admitting: Family Medicine

## 2020-05-08 NOTE — Progress Notes (Signed)
°  Chronic Care Management   Note  05/08/2020 Name: Clifford Houston MRN: [id number] DOB: August 18, 1946  Clifford Houston is a 74 y.o. year old male who is a primary care patient of Marin Olp, MD. I reached out to [name] by phone today in response to a referral sent by [name]'s PCP, Marin Olp, MD.   [name] was given information about Chronic Care Management services today including:  1. CCM service includes personalized support from designated clinical staff supervised by his physician, including individualized plan of care and coordination with other care providers 2. 24/7 contact phone numbers for assistance for urgent and routine care needs. 3. Service will only be billed when office clinical staff spend 20 minutes or more in a month to coordinate care. 4. Only one practitioner may furnish and bill the service in a calendar month. 5. The patient may stop CCM services at any time (effective at the end of the month) by phone call to the office staff.   Patient agreed to services and verbal consent obtained.   Follow up plan:   Carley Perdue [employer] Scheduler

## 2020-05-08 NOTE — Progress Notes (Signed)
°  Chronic Care Management   Outreach Note  05/08/2020 Name: Clifford Houston MRN: [id number] DOB: [birth year]  Referred by: Marin Olp, MD Reason for referral : No chief complaint on file.   An unsuccessful telephone outreach was attempted today. The patient was referred to the pharmacist for assistance with care management and care coordination.   Follow Up Plan:   Carley Perdue [employer] Scheduler

## 2020-05-22 ENCOUNTER — Ambulatory Visit: Payer: Medicare HMO

## 2020-05-22 DIAGNOSIS — E785 Hyperlipidemia, unspecified: Secondary | ICD-10-CM

## 2020-05-22 DIAGNOSIS — I10 Essential (primary) hypertension: Secondary | ICD-10-CM

## 2020-05-22 NOTE — Patient Instructions (Addendum)
Please call me at [phone] (direct line) with any questions - thank you!  - Edyth Gunnels., Clinical Pharmacist  Goals Addressed            This Visit's Progress    [place] (see longitudinal plan of care for additional care plan information)  Current Barriers:   Chronic Disease Management support, education, and care coordination needs related to Hypertension and Hyperlipidemia   Hypertension BP Readings from Last 3 Encounters:  01/28/20 138/80  06/23/19 134/78  11/25/18 132/80    Pharmacist Clinical Goal(s): o Over the next 180 days, patient will work with PharmD and providers to achieve BP goal <130/80  Current regimen:  o Amlodipine 10 mg once daily   Interventions: o Home BP monitoring   Patient self care activities - Over the next 180 days, patient will: o Check BP at least once every 1-2 weeks, document, and provide at future appointments o Ensure daily salt intake < 2300 mg/day  Hyperlipidemia Lab Results  Component Value Date/Time   LDLCALC 117 (H) 01/28/2020 09:09 AM   LDLDIRECT 109.0 06/29/2018 10:13 AM    Pharmacist Clinical Goal(s): o Over the next 180 days, patient will work with PharmD and providers to achieve LDL goal < 100  Current regimen:  o RYR 600 mg four times a week o Fish oil 2 grams daily  Interventions: o Discussed alternative lipid lowering medications such as repatha  Patient self care activities - Over the next 180 days, patient will: o Interior and spatial designer with any questions.    Medication management  Pharmacist Clinical Goal(s): o Over the next 180 days, patient will work with PharmD and providers to maintain optimal medication adherence  Current pharmacy: [facility]  Interventions o Comprehensive medication review performed. o Continue current medication management strategy  Patient self care activities - Over the next 180 days, patient will: o Focus on medication adherence by taking  medications as prescribed o Report any questions or concerns to PharmD and/or provider(s) Initial goal documentation.      [name] was given information about Chronic Care Management services today including:  1. CCM service includes personalized support from designated clinical staff supervised by his physician, including individualized plan of care and coordination with other care providers 2. 24/7 contact phone numbers for assistance for urgent and routine care needs. 3. Standard insurance, coinsurance, copays and deductibles apply for chronic care management only during months in which we provide at least 20 minutes of these services. Most insurances cover these services at 100%, however patients may be responsible for any copay, coinsurance and/or deductible if applicable. This service may help you avoid the need for more expensive face-to-face services. 4. Only one practitioner may furnish and bill the service in a calendar month. 5. The patient may stop CCM services at any time (effective at the end of the month) by phone call to the office staff.  Patient agreed to services and verbal consent obtained.   The patient verbalized understanding of instructions provided today and agreed to receive a mailed copy of patient instruction and/or educational materials. Telephone follow up appointment with pharmacy team member scheduled for: See next appointment with "Care Management Staff" under "What's Next" below.   Madelin Rear, Pharm.D., BCGP Clinical Pharmacist [facility] [phone] High Cholesterol  High cholesterol is a condition in which the blood has high levels of a white, waxy, fat-like substance (cholesterol).  The human body needs small amounts of cholesterol. The liver makes all the cholesterol that the body needs. Extra (excess) cholesterol comes from the food that we eat. Cholesterol is carried from the liver by the blood  through the blood vessels. If you have high cholesterol, deposits (plaques) may build up on the walls of your blood vessels (arteries). Plaques make the arteries narrower and stiffer. Cholesterol plaques increase your risk for heart attack and stroke. Work with your health care provider to keep your cholesterol levels in a healthy range. What increases the risk? This condition is more likely to develop in people who:  Eat foods that are high in animal fat (saturated fat) or cholesterol.  Are overweight.  Are not getting enough exercise.  Have a family history of high cholesterol. What are the signs or symptoms? There are no symptoms of this condition. How is this diagnosed? This condition may be diagnosed from the results of a blood test.  If you are older than age 61, your health care provider may check your cholesterol every 4-6 years.  You may be checked more often if you already have high cholesterol or other risk factors for heart disease. The blood test for cholesterol measures:  "Bad" cholesterol (LDL cholesterol). This is the main type of cholesterol that causes heart disease. The desired level for LDL is less than 100.  "Good" cholesterol (HDL cholesterol). This type helps to protect against heart disease by cleaning the arteries and carrying the LDL away. The desired level for HDL is 60 or higher.  Triglycerides. These are fats that the body can store or burn for energy. The desired number for triglycerides is lower than 150.  Total cholesterol. This is a measure of the total amount of cholesterol in your blood, including LDL cholesterol, HDL cholesterol, and triglycerides. A healthy number is less than 200. How is this treated? This condition is treated with diet changes, lifestyle changes, and medicines. Diet changes  This may include eating more whole grains, fruits, vegetables, nuts, and fish.  This may also include cutting back on red meat and foods that have a lot of  added sugar. Lifestyle changes  Changes may include getting at least 40 minutes of aerobic exercise 3 times a week. Aerobic exercises include walking, biking, and swimming. Aerobic exercise along with a healthy diet can help you maintain a healthy weight.  Changes may also include quitting smoking. Medicines  Medicines are usually given if diet and lifestyle changes have failed to reduce your cholesterol to healthy levels.  Your health care provider may prescribe a statin medicine. Statin medicines have been shown to reduce cholesterol, which can reduce the risk of heart disease. Follow these instructions at home: Eating and drinking If told by your health care provider:  Eat chicken (without skin), fish, veal, shellfish, ground [place] breast, and round or loin cuts of red meat.  Do not eat fried foods or fatty meats, such as hot dogs and salami.  Eat plenty of fruits, such as apples.  Eat plenty of vegetables, such as broccoli, potatoes, and carrots.  Eat beans, peas, and lentils.  Eat grains such as barley, rice, couscous, and bulgur wheat.  Eat pasta without cream sauces.  Use skim or nonfat milk, and eat low-fat or nonfat yogurt and cheeses.  Do not eat or drink whole milk, cream, ice cream, egg yolks, or hard cheeses.  Do not eat stick margarine or tub margarines that contain trans fats (also called partially hydrogenated oils).  Do not eat saturated tropical oils, such as coconut oil and palm oil.  Do not eat cakes, cookies, crackers, or other baked goods that contain trans fats.  General instructions  Exercise as directed by your health care provider. Increase your activity level with activities such as gardening, walking, and taking the stairs.  Take over-the-counter and prescription medicines only as told by your health care provider.  Do not use any products that contain nicotine or tobacco, such as cigarettes and e-cigarettes. If you need help quitting, ask  your health care provider.  Keep all follow-up visits as told by your health care provider. This is important. Contact a health care provider if:  You are struggling to maintain a healthy diet or weight.  You need help to start on an exercise program.  You need help to stop smoking. Get help right away if:  You have chest pain.  You have trouble breathing. This information is not intended to replace advice given to you by your health care provider. Make sure you discuss any questions you have with your health care provider. Document Revised: 10/03/2017 Document Reviewed: 03/30/2016 Elsevier Patient Education  [place] injection What is this medicine? EVOLOCUMAB (e voe LOK ue mab) is known as a PCSK9 inhibitor. It is used to lower the level of cholesterol in the blood. It may be used alone or in combination with other cholesterol-lowering drugs. This drug may also be used to reduce the risk of heart attack, stroke, and certain types of heart surgery in patients with heart disease. This medicine may be used for other purposes; ask your health care provider or pharmacist if you have questions. COMMON BRAND NAME(S): Repatha What should I tell my health care provider before I take this medicine? They need to know if you have any of these conditions:  an unusual or allergic reaction to evolocumab, other medicines, latex, foods, dyes, or preservatives  pregnant or trying to get pregnant  breast-feeding How should I use this medicine? This medicine is for injection under the skin. You will be taught how to prepare and give this medicine. Use exactly as directed. Take your medicine at regular intervals. Do not take your medicine more often than directed. It is important that you put your used needles and syringes in a special sharps container. Do not put them in a trash can. If you do not have a sharps container, call your pharmacist or health care provider to  get one. Talk to your pediatrician regarding the use of this medicine in children. While this drug may be prescribed for children as young as 13 years for selected conditions, precautions do apply. Overdosage: If you think you have taken too much of this medicine contact a poison control center or emergency room at once. NOTE: This medicine is only for you. Do not share this medicine with others. What if I miss a dose? If you miss a dose, take it as soon as you can if there are more than 7 days until the next scheduled dose, or skip the missed dose and take the next dose according to your original schedule. Do not take double or extra doses. What may interact with this medicine? Interactions are not expected. This list may not describe all possible interactions. Give your health care provider a list of all the medicines, herbs, non-prescription drugs, or dietary supplements you use. Also tell them if you smoke, drink alcohol, or use illegal drugs. Some  items may interact with your medicine. What should I watch for while using this medicine? Visit your health care provider for regular checks on your progress. Tell your health care provider if your symptoms do not start to get better or if they get worse. You may need blood work done while you are taking this drug. Do not wear the on-body infuser during an MRI. What side effects may I notice from receiving this medicine? Side effects that you should report to your doctor or health care professional as soon as possible:  allergic reactions like skin rash, itching or hives, swelling of the face, lips, or tongue  signs and symptoms of high blood sugar such as dizziness; dry mouth; dry skin; fruity breath; nausea; stomach pain; increased hunger or thirst; increased urination  signs and symptoms of infection like fever or chills; cough; sore throat; pain or trouble passing urine Side effects that usually do not require medical attention (report to your  doctor or health care professional if they continue or are bothersome):  diarrhea  nausea  muscle pain  pain, redness, or irritation at site where injected This list may not describe all possible side effects. Call your doctor for medical advice about side effects. You may report side effects to FDA at 1-800-FDA-1088. Where should I keep my medicine? Keep out of the reach of children. You will be instructed on how to store this medicine. Throw away any unused medicine after the expiration date on the label. NOTE: This sheet is a summary. It may not cover all possible information. If you have questions about this medicine, talk to your doctor, pharmacist, or health care provider.  2020 Elsevier/Gold Standard (2019-08-03 16:22:29)

## 2020-05-22 NOTE — Progress Notes (Signed)
Chronic Care Management Pharmacy  Name: Clifford Houston  MRN: [id number] DOB: 1946-07-03  Chief Complaint/ HPI  Clifford Houston,  74 y.o. , male presents for their Initial CCM visit with the clinical pharmacist via telephone due to COVID-19 Pandemic.  PCP : Marin Olp, MD  Chronic conditions include:  Encounter Diagnoses  Name Primary?  . Hyperlipidemia, unspecified hyperlipidemia type Yes  . Essential hypertension     Office Visits:  04/04/2020 (TE, PCP): pt message, red yeast preferred due to rosuvastatin not tolerated, wants to take CL top pick 2-3 months then get lab and send results. 01/28/2020 (PCP): annual, LDL elevated, ASCVD ~26%  Patient Active Problem List   Diagnosis Date Noted  . Cervical radiculopathy 11/20/2017  . Multiple thyroid nodules 08/13/2017  . History of thyrotoxicosis 08/13/2017  . Hyperglycemia 11/19/2016  . Former smoker 11/14/2014  . History of skin cancer 11/07/2014  . Hyperlipidemia 05/05/2007  . Essential hypertension 05/05/2007  . GERD 05/05/2007   Past Surgical History:  Procedure Laterality Date  . CATARACT EXTRACTION, BILATERAL     dec 6th 2018 right eye, dec 20th 2018 left eye  . THYROID SURGERY     biopsy 2007 benign   Social History   Socioeconomic History  . Marital status: Married    Spouse name: Not on file  . Number of children: Not on file  . Years of education: Not on file  . Highest education level: Not on file  Occupational History  . Not on file  Tobacco Use  . Smoking status: Former Smoker    Packs/day: 1.00    Years: 6.00    Pack years: 6.00    Types: Cigarettes    Quit date: 10/14/1965    Years since quitting: 54.6  . Smokeless tobacco: Never Used  Vaping Use  . Vaping Use: Never used  Substance and Sexual Activity  . Alcohol use: Yes    Alcohol/week: 1.0 standard drink    Types: 1 Standard drinks or equivalent per week  . Drug use: No  . Sexual activity: Not on file  Other Topics Concern  .  Not on file  Social History Narrative   Married (wife patient of Dr. Regis Bill). 2 sons. 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       Hobbies: golf-runs church golf group, go see grandkids. Walk 3 miles alternating days with going to [facility])   Social Determinants of Health   Financial Resource Strain:   . Difficulty of Paying Living Expenses:   Food Insecurity: No Food Insecurity  . Worried About Charity fundraiser in the Last Year: Never true  . Ran Out of Food in the Last Year: Never true  Transportation Needs: No Transportation Needs  . Lack of Transportation (Medical): No  . Lack of Transportation (Non-Medical): No  Physical Activity:   . Days of Exercise per Week:   . Minutes of Exercise per Session:   Stress:   . Feeling of Stress :   Social Connections:   . Frequency of Communication with Friends and Family:   . Frequency of Social Gatherings with Friends and Family:   . Attends Religious Services:   . Active Member of Clubs or Organizations:   . Attends Archivist Meetings:   Marland Kitchen Marital Status:   SDOH (Social Determinants of Health)  assessments performed: Yes. Family History  Problem Relation Age of Onset  . Heart disease Father 37       smoker  . Thyroid disease Father        goiter  . Hypertension Sister   . Asthma Sister   . Emphysema Sister   . Heart attack Brother 51       did not eat well, no medical care  . Hypertension Brother   . Heart disease Brother   . Diabetes Mother   . Stroke Mother   . Diabetes Sister   . Thyroid disease Sister   . Hypertension Sister   . Hypertension Brother    Allergies  Allergen Reactions  . Lisinopril     Mild cough   Outpatient Encounter Medications as of 05/22/2020  Medication Sig  . amLODipine (NORVASC) 10 MG tablet Take 1 tablet (10 mg total) by mouth  daily.  Marland Kitchen co-enzyme Q-10 30 MG capsule Take 300 mg by mouth daily.   . fish oil-omega-3 fatty acids 1000 MG capsule Take 2 g by mouth daily.    . Multiple Vitamin (MULTIVITAMIN) tablet Take 1 tablet by mouth 3 (three) times a week.   . Red Yeast Rice Extract (RED YEAST RICE PO) Take 600 mg by mouth 4 (four) times a week.   . rosuvastatin (CRESTOR) 5 MG tablet Take 1 tablet (5 mg total) by mouth daily.   No facility-administered encounter medications on file as of 05/22/2020.   Patient Care Team    Relationship Specialty Notifications Start End  Shelva Majestic, MD PCP - General Family Medicine  11/07/14    Comment: Carlyon Shadow, MD Attending Physician Gastroenterology  01/09/12   Carlus Pavlov, MD Consulting Physician Endocrinology  02/04/20   Dahlia Byes, [facility] Pharmacist Pharmacist  05/08/20    Comment: Phone: [phone]   Current Diagnosis/Assessment: Goals Addressed            This Visit's Progress   . PharmD Care Plan       CARE PLAN ENTRY (see longitudinal plan of care for additional care plan information)  Current Barriers:  . Chronic Disease Management support, education, and care coordination needs related to Hypertension and Hyperlipidemia   Hypertension BP Readings from Last 3 Encounters:  01/28/20 138/80  06/23/19 134/78  11/25/18 132/80   . Pharmacist Clinical Goal(s): o Over the next 180 days, patient will work with PharmD and providers to achieve BP goal <130/80 . Current regimen:  o Amlodipine 10 mg once daily  . Interventions: o Home BP monitoring  . Patient self care activities - Over the next 180 days, patient will: o Check BP at least once every 1-2 weeks, document, and provide at future appointments o Ensure daily salt intake < 2300 mg/day  Hyperlipidemia Lab Results  Component Value Date/Time   LDLCALC 117 (H) 01/28/2020 09:09 AM   LDLDIRECT 109.0 06/29/2018 10:13 AM   . Pharmacist Clinical Goal(s): o Over the next 180 days,  patient will work with PharmD and providers to achieve LDL goal < 100 . Current regimen:  o RYR 600 mg four times a week o Fish oil 2 grams daily . Interventions: o Discussed alternative lipid lowering medications such as repatha . Patient self care activities - Over the next 180 days, patient will: o Horticulturist, commercial with any questions.    Medication management . Pharmacist Clinical Goal(s): o Over the next 180 days, patient will work with PharmD and providers to maintain optimal  medication adherence . Current pharmacy[facility] . Interventions o Comprehensive medication review performed. o Continue current medication management strategy . Patient self care activities - Over the next 180 days, patient will: o Focus on medication adherence by taking medications as prescribed o Report any questions or concerns to PharmD and/or provider(s) Initial goal documentation.      Hypertension   BP goal <130/80  BP Readings from Last 3 Encounters:  01/28/20 138/80  06/23/19 134/78  11/25/18 132/80   Patient has failed these meds in the past: Lisinopril (cough) Patient checks BP at home daily. Patient home BP readings are ranging: 120s-130s/70s.  Patient is currently controlled on the following medications:  . Amlodipine 10 mg once daily  We discussed diet and exercise extensively.  Plan  Continue current medications and control with diet and exercise.   Hyperlipidemia   LDL goal < 100  Lipid Panel     Component Value Date/Time   CHOL 178 01/28/2020 0909   TRIG 70.0 01/28/2020 0909   HDL 47.50 01/28/2020 0909   LDLCALC 117 (H) 01/28/2020 0909   LDLDIRECT 109.0 06/29/2018 1013    Hepatic Function Latest Ref Rng & Units 01/28/2020 11/23/2018 11/20/2017  Total Protein 6.0 - 8.3 g/dL 7.6 7.2 7.4  Albumin 3.5 - 5.2 g/dL 4.8 4.6 4.5  AST 0 - 37 U/L '24 23 22  '$ ALT 0 - 53 U/L '16 16 16  '$ Alk Phosphatase 39 - 117 U/L 53 49 49  Total Bilirubin 0.2 - 1.2 mg/dL 0.6 0.6 0.5    Bilirubin, Direct 0.0 - 0.3 mg/dL - - -    The 10-year ASCVD risk score Mikey Bussing DC Jr., et al., 2013) is: 25.9%   Values used to calculate the score:     Age: 72 years     Sex: Male     Is Non-Hispanic African American: No     Diabetic: No     Tobacco smoker: No     Systolic Blood Pressure: 664 mmHg     Is BP treated: Yes     HDL Cholesterol: 47.5 mg/dL     Total Cholesterol: 178 mg/dL   Patient has failed these meds in past: rosuvastatin (aches, rash). Patient is currently taking the following medications:  . Red yeast rice 600 mg four times a week . Fish oil 2 gram daily  We discussed:  diet and exercise extensively.  Plan  Continue current medications and control with diet and exercise.  Vaccines   Immunization History  Administered Date(s) Administered  . Influenza Split 07/14/2012, 06/28/2013  . Influenza Whole 07/14/2019  . Influenza, High Dose Seasonal PF 06/29/2018  . Influenza-Unspecified 07/17/2014, 06/15/2015, 07/31/2016, 06/30/2017  . PFIZER SARS-COV-2 Vaccination 11/11/2019, 12/09/2019  . Pneumococcal Conjugate-13 11/07/2014  . Pneumococcal Polysaccharide-23 10/06/2012  . Tdap 01/08/2010, 02/22/2020  . Zoster 01/08/2010  . Zoster Recombinat (Shingrix) 10/26/2018, 03/23/2019   Reviewed patient's vaccination history.    Plan  No recommendations at this time.   Medication Management Coordination   Rece[facility] prescription medications [facility]:  [facility] [place] [place] [facility] [address] [place] [address] Phone: [phone] Fax: [phone]  [id number] [place] [place]Phone: [phone]: [phone]   No issues w/ current medication management.   Plan  Continue current medication management strategy. ___________________________  Visit follow-up:  . CMA follow-up: 376-monthP  call. .Marland Kitchen  RPH follow-up: 6 month phone visit - HLD.  Madelin Rear, Pharm.D., BCGP Clinical Pharmacist [place] [phone]

## 2020-05-22 NOTE — Progress Notes (Signed)
I, Garret Reddish, MD, have reviewed all documentation for this visit. The documentation on 05/22/20 for the exam, diagnosis, procedures, and orders are all accurate and complete.  Garret Reddish, MD

## 2020-05-27 NOTE — Progress Notes (Signed)
Phone [phone] In person visit   Subjective:   Clifford Houston is a 74 y.o. year old very pleasant male patient who presents for/with See problem oriented charting Chief Complaint  Patient presents with  . Follow-up   This visit occurred during the SARS-CoV-2 public health emergency.  Safety protocols were in place, including screening questions prior to the visit, additional usage of staff PPE, and extensive cleaning of exam room while observing appropriate contact time as indicated for disinfecting solutions.   Past Medical History-  Patient Active Problem List   Diagnosis Date Noted  . Hyperglycemia 11/19/2016    Priority: Medium  . Hyperlipidemia 05/05/2007    Priority: Medium  . Essential hypertension 05/05/2007    Priority: Medium  . Former smoker 11/14/2014    Priority: Low  . History of skin cancer 11/07/2014    Priority: Low  . GERD 05/05/2007    Priority: Low  . Cervical radiculopathy 11/20/2017  . Multiple thyroid nodules 08/13/2017  . History of thyrotoxicosis 08/13/2017    Medications- reviewed and updated Current Outpatient Medications  Medication Sig Dispense Refill  . co-enzyme Q-10 30 MG capsule Take 300 mg by mouth daily.     . fish oil-omega-3 fatty acids 1000 MG capsule Take 2 g by mouth daily.      . Multiple Vitamin (MULTIVITAMIN) tablet Take 1 tablet by mouth 3 (three) times a week.     . Red Yeast Rice Extract (RED YEAST RICE PO) Take 600 mg by mouth 4 (four) times a week.     Marland Kitchen amLODipine (NORVASC) 10 MG tablet Take 1 tablet (10 mg total) by mouth daily. (Patient not taking: Reported on 05/29/2020) 90 tablet 3   No current facility-administered medications for this visit.     Objective:  BP (!) 160/84   Pulse 73   Temp 98.6 F (37 C)   Ht 5\' 11"  (1.803 m)   Wt 166 lb 6.4 oz (75.5 kg)   SpO2 98%   BMI 23.21 kg/m  Gen: NAD, resting comfortably CV: RRR no murmurs rubs or gallops Lungs: CTAB no crackles, wheeze, rhonchi Ext: no  edema Skin: warm, dry    Assessment and Plan   # Burning hands/rash  S: pt c/o burning on the back of hands, in between fingers and his arms and neck. He states he atributed it to rosuvastatin but he has stopped that and the burning still continued. He tried stopping his blood pressure medicine and noticed the burning went away so he thinks its isolated to the BP meds.  Has a rash in inner webs of some fingers as well as some small red spots on his neck and upper chest.  He brings a record of blood pressure readings and blood pressure on the medicine is well controlled but off the medicine began to slip up to 153/91.  He experimented and on days that he takes the medicine he has a burning sensation in days that he takes the medicine away-the burning sensation resolves. A/P: Poor control of blood pressure but excellent detective work  By Mr. Purdy as above.  We will discontinue amlodipine-it certainly seems that rash and burning sensation is coming from this medicine.  I listed it under allergies. -He tolerated lisinopril in the past but had a cough-we will trial an alternate with irbesartan 150 mg.  I would like for him to monitor at home and update me in 2 to 3 weeks with how his blood pressure is doing and  if his rash is resolving  Recommended follow up:   Keep April visit unless he needs [place] sooner or doesn't tolerate irbesartan or BP average above 140/90 at home Future Appointments  Date Time Provider [place]  11/27/2020  1:00 PM [facility] CCM PHARMACIST [facility]  01/29/2021  8:20 AM Marin Olp, MD [facility]  02/01/2021  9:00 AM Philemon Kingdom, MD [facility] None   Lab/Order associations:   ICD-10-CM   1. Essential hypertension  I10     Meds ordered this encounter  Medications  . irbesartan (AVAPRO) 150 MG tablet    Sig: Take 1 tablet (150 mg total) by mouth daily.    Dispense:  30 tablet    Refill:  5   Return precautions advised.  Garret Reddish,  MD

## 2020-05-29 ENCOUNTER — Other Ambulatory Visit: Payer: Self-pay

## 2020-05-29 ENCOUNTER — Encounter: Payer: Self-pay | Admitting: Family Medicine

## 2020-05-29 ENCOUNTER — Ambulatory Visit (INDEPENDENT_AMBULATORY_CARE_PROVIDER_SITE_OTHER): Payer: Medicare HMO | Admitting: Family Medicine

## 2020-05-29 VITALS — BP 160/84 | HR 73 | Temp 98.6°F | Ht 71.0 in | Wt 166.4 lb

## 2020-05-29 DIAGNOSIS — R739 Hyperglycemia, unspecified: Secondary | ICD-10-CM

## 2020-05-29 DIAGNOSIS — E785 Hyperlipidemia, unspecified: Secondary | ICD-10-CM

## 2020-05-29 DIAGNOSIS — I10 Essential (primary) hypertension: Secondary | ICD-10-CM

## 2020-05-29 MED ORDER — IRBESARTAN 150 MG PO TABS: 150.0000 mg | ORAL_TABLET | Freq: Every day | ORAL | 5 refills | Status: DC

## 2020-05-29 NOTE — Patient Instructions (Addendum)
Health Maintenance Due  Topic Date Due  . INFLUENZA VACCINE - - will complete later in flu season (please let us know if you get this at another location so we can update your chart) . We should have vaccination here in 1-2 months - can call back for an appointment.   05/14/2020   -He tolerated lisinopril in the past but had a cough-we will trial an alternate with irbesartan 150 mg.  I would like for him to monitor at home and update me in 2 to 3 weeks with how his blood pressure is doing and if his rash is resolving  - also schedule lab visit in 2-3 weeks so we can recheck kidney function.

## 2020-05-29 NOTE — Addendum Note (Signed)
Addended by: Marin Olp on: 05/29/2020 08:26 AM   Modules accepted: Orders

## 2020-06-15 ENCOUNTER — Other Ambulatory Visit: Payer: Self-pay

## 2020-06-15 ENCOUNTER — Other Ambulatory Visit: Payer: Medicare HMO

## 2020-06-15 DIAGNOSIS — I10 Essential (primary) hypertension: Secondary | ICD-10-CM | POA: Diagnosis not present

## 2020-06-15 NOTE — Addendum Note (Signed)
Addended by: Marin Olp on: 06/15/2020 08:06 AM   Modules accepted: Orders

## 2020-06-16 ENCOUNTER — Encounter: Payer: Self-pay | Admitting: Family Medicine

## 2020-06-16 LAB — BASIC METABOLIC PANEL
BUN: 12 mg/dL (ref 7–25)
CO2: 28 mmol/L (ref 20–32)
Calcium: 9.7 mg/dL (ref 8.6–10.3)
Chloride: 102 mmol/L (ref 98–110)
Creat: 1.18 mg/dL (ref 0.70–1.18)
Glucose, Bld: 104 mg/dL — ABNORMAL HIGH (ref 65–99)
Potassium: 4.7 mmol/L (ref 3.5–5.3)
Sodium: 135 mmol/L (ref 135–146)

## 2020-06-21 NOTE — Telephone Encounter (Signed)
Patient is following up regarding this

## 2020-06-22 ENCOUNTER — Telehealth: Payer: Self-pay | Admitting: Family Medicine

## 2020-06-22 NOTE — Telephone Encounter (Signed)
Patient would like to come early morning the one day the week of October 4-8 due to fasting he would like to beforehand to check cholesterol and patient doesn't want to fast until his apt time can we put orders in to get lab work done prior to appointment

## 2020-06-22 NOTE — Telephone Encounter (Signed)
Is this ok? If so, do you know what labs I can order? This isn't  a CPE its a f/u on BP visit.

## 2020-06-22 NOTE — Telephone Encounter (Signed)
Check lipid panel and BMP with GFR under hyperlipidemia unspecified please.  He may have to pay full cost of lipid panel since it has not been a full year but I think it is under $50

## 2020-06-23 ENCOUNTER — Ambulatory Visit: Payer: Medicare HMO | Attending: Internal Medicine

## 2020-06-23 DIAGNOSIS — Z23 Encounter for immunization: Secondary | ICD-10-CM

## 2020-06-23 NOTE — Progress Notes (Signed)
   Covid-19 Vaccination Clinic  Name:  TASHI BAND    MRN: [id number] DOB: 1946/08/09  06/23/2020  Mr. Zanella was observed post Covid-19 immunization for 15 minutes without incident. He was provided with Vaccine Information Sheet and instruction to access the V-Safe system.   Mr. Haggart was instructed to call 911 with any severe reactions post vaccine: Marland Kitchen Difficulty breathing  . Swelling of face and throat  . A fast heartbeat  . A bad rash all over body  . Dizziness and weakness

## 2020-06-26 ENCOUNTER — Other Ambulatory Visit: Payer: Self-pay

## 2020-06-26 DIAGNOSIS — E785 Hyperlipidemia, unspecified: Secondary | ICD-10-CM

## 2020-06-26 NOTE — Telephone Encounter (Signed)
Called and spoke with pt. Future labs ordered and lab appointment made for 07/17/20 at 8:15am,

## 2020-06-30 ENCOUNTER — Encounter: Payer: Self-pay | Admitting: Family Medicine

## 2020-07-03 DIAGNOSIS — L309 Dermatitis, unspecified: Secondary | ICD-10-CM | POA: Diagnosis not present

## 2020-07-04 ENCOUNTER — Ambulatory Visit: Payer: Medicare HMO | Admitting: Family Medicine

## 2020-07-17 ENCOUNTER — Other Ambulatory Visit: Payer: Medicare HMO

## 2020-07-17 ENCOUNTER — Other Ambulatory Visit: Payer: Self-pay

## 2020-07-17 DIAGNOSIS — E785 Hyperlipidemia, unspecified: Secondary | ICD-10-CM | POA: Diagnosis not present

## 2020-07-17 DIAGNOSIS — R69 Illness, unspecified: Secondary | ICD-10-CM | POA: Diagnosis not present

## 2020-07-17 NOTE — Addendum Note (Signed)
Addended by: Milton Ferguson D on: 07/17/2020 08:15 AM   Modules accepted: Orders

## 2020-07-18 ENCOUNTER — Encounter: Payer: Self-pay | Admitting: Family Medicine

## 2020-07-18 LAB — LIPID PANEL
Cholesterol: 192 mg/dL (ref <200)
HDL: 52 mg/dL (ref >=40)
LDL Cholesterol (Calc): 121 mg/dL (calc) — ABNORMAL HIGH
Non-HDL Cholesterol (Calc): 140 mg/dL (calc) — ABNORMAL HIGH (ref <130)
Total CHOL/HDL Ratio: 3.7 (calc) (ref <5.0)
Triglycerides: 91 mg/dL (ref <150)

## 2020-07-18 LAB — BASIC METABOLIC PANEL WITH GFR
BUN: 12 mg/dL (ref 7–25)
CO2: 27 mmol/L (ref 20–32)
Calcium: 9.9 mg/dL (ref 8.6–10.3)
Chloride: 102 mmol/L (ref 98–110)
Creat: 1.08 mg/dL (ref 0.70–1.18)
GFR, Est African American: 78 mL/min/{1.73_m2} (ref >=60)
GFR, Est Non African American: 68 mL/min/{1.73_m2} (ref >=60)
Glucose, Bld: 104 mg/dL — ABNORMAL HIGH (ref 65–99)
Potassium: 4.3 mmol/L (ref 3.5–5.3)
Sodium: 137 mmol/L (ref 135–146)

## 2020-07-19 NOTE — Progress Notes (Signed)
Phone [phone] In person visit   Subjective:   Clifford Houston is a 74 y.o. year old very pleasant male patient who presents for/with See problem oriented charting Chief Complaint  Patient presents with  . Hypertension  . Hyperlipidemia   This visit occurred during the SARS-CoV-2 public health emergency.  Safety protocols were in place, including screening questions prior to the visit, additional usage of staff PPE, and extensive cleaning of exam room while observing appropriate contact time as indicated for disinfecting solutions.   Past Medical History-  Patient Active Problem List   Diagnosis Date Noted  . Burning sensation 07/20/2020    Priority: Medium  . Cervical radiculopathy 11/20/2017    Priority: Medium  . Multiple thyroid nodules 08/13/2017    Priority: Medium  . History of thyrotoxicosis 08/13/2017    Priority: Medium  . Hyperglycemia 11/19/2016    Priority: Medium  . Hyperlipidemia 05/05/2007    Priority: Medium  . Essential hypertension 05/05/2007    Priority: Medium  . Former smoker 11/14/2014    Priority: Low  . History of skin cancer 11/07/2014    Priority: Low  . GERD 05/05/2007    Priority: Low    Medications- reviewed and updated Current Outpatient Medications  Medication Sig Dispense Refill  . co-enzyme Q-10 30 MG capsule Take 300 mg by mouth daily.     . fish oil-omega-3 fatty acids 1000 MG capsule Take 2 g by mouth daily.      . Multiple Vitamin (MULTIVITAMIN) tablet Take 1 tablet by mouth 3 (three) times a week.     Marland Kitchen amLODipine (NORVASC) 10 MG tablet Take 10 mg by mouth daily.    . Red Yeast Rice Extract (RED YEAST RICE PO) Take 600 mg by mouth 4 (four) times a week.      No current facility-administered medications for this visit.     Objective:  BP 124/70   Pulse 72   Temp 98 F (36.7 C) (Temporal)   Resp 18   Ht 5\' 11"  (1.803 m)   Wt 171 lb (77.6 kg)   SpO2 95%   BMI 23.85 kg/m  Gen: NAD, resting comfortably CV: RRR no  murmurs rubs or gallops Lungs: CTAB no crackles, wheeze, rhonchi Ext: no edema Skin: warm, dry Neuro: Burning reported over bilateral hands and forearms-states very superficial peeling.  No rash noted MSK: Negative Spurling test    Assessment and Plan    #Burning sensation of the hands S: Patient for started with burning sensation of the hands after being started on rosuvastatin-this seemed to improve when coming off medication.  He later restarted red yeast rice and symptoms recurred.  Now he is concerned that this could be coming from amlodipine as it has persisted  Patient does have history of cervical disc issues/bulging disc in the past-has continued his long-term exercises for this.  He states current discomfort is not like the deep burning sensation he had in his forearms and hands previously but is more superficial.  Patient has been seen dermatology who has recommended triamcinolone twice a day for 2 weeks-he has been trying this with some relief.  They also discussed potentially coming from his neck but since sensation is feels different (also had neck pain and numbness and third and fourth finger on the left hand and had neck issues in the past) patient does not strongly suspect that. A/P: Patient would like to be off rosuvastatin, red yeast rice, amlodipine all at the same time and see  if this makes a difference with burning sensation of the hands.  We also discussed possibly checking a B12.  TSH and A1c have been reassuring this year.  He will keep me updated through my chart -Spurling test negative but we discussed possible trial of prednisone-discussed likely would want him back on amlodipine before starting prednisone since this can raise blood pressure and we also discussed risk of raising blood sugar  -Continue home exercises for prior cervical radiculopathy-possible present cervical radiculopathy -Also having less than once a day pain in right lower leg radiating to his foot  short-lived and not increasing in frequency for 2 to 3 weeks-she will let me know if this worsens and we discussed could be a slight bulging disc-prednisone would also possibly help this -No rash on the hand so not sure dermatology follow-up as helpful  #hypertension S: compliant with amlodipine 10 mg BP Readings from Last 3 Encounters:  07/20/20 124/70  05/29/20 (!) 160/84  01/28/20 138/80  -patient was concerned about creatinine increase on irbesartan so opted to come off of that A/P: Excellent control today but patient would like to try off amlodipine to see if it could be contributing to burning in the hands.  We will trial off medicine as long as blood pressure remains below 155/95 for up to 6 weeks maximum.  We discussed slight increased heart attack or stroke risk when letting blood pressure trend up   #hyperlipidemia S: compliant with red yeast rice 600 mg 4 times a week in the past-has stopped this as well he restarted it was having issues with burning/itching increasing Burning/itching in hands on rosuvastatin improved off medicine the recurred. He has done a lot of research and is Very concerned about risk fo statins A/P: Suspect poor control of hyperlipidemia.  We reviewed prior coronary calcium score of 48.6.  I told him I have done some further research and most of statin benefit begins over 100 for coronary calcium score-we opted to remain off statin and red yeast rice for now.  We may restart red yeast rice depending on progress with burning in the hands   Recommended follow up: Keep April visit or sooner if needed Future Appointments  Date Time Provider [place]  11/27/2020  1:00 PM [facility] CCM PHARMACIST [facility]  01/29/2021  8:20 AM Marin Olp, MD [facility]  02/01/2021  9:00 AM Philemon Kingdom, MD [facility] None    Lab/Order associations:   ICD-10-CM   1. Essential hypertension  I10   2. Hyperlipidemia, unspecified hyperlipidemia type  E78.5    3. Burning sensation  R20.8   4. Cervical radiculopathy  M54.12     Time Spent: 48 minutes of total time (7:40-7:50 reviewing information patient had submitted/chart review (13 pages of info submitted by patient) and commenting on this for patient, 9:17 AM- 9:55 AM) was spent on the date of the encounter performing the following actions: chart review prior to seeing the patient, obtaining history, performing a medically necessary exam, counseling on the treatment plan, placing orders, and documenting in our EHR.   Return precautions advised.  Garret Reddish, MD

## 2020-07-19 NOTE — Patient Instructions (Addendum)
Ok to trial off amlodipine for several days or a week lets say as long as blood pressure <155/95 and no new  Symptoms  Hold off on red yeast rice for now  If no improvement off both of these- lets consider a course of prednisone in case there is an irritated nerve root in the neck.

## 2020-07-20 ENCOUNTER — Encounter: Payer: Self-pay | Admitting: Family Medicine

## 2020-07-20 ENCOUNTER — Ambulatory Visit (INDEPENDENT_AMBULATORY_CARE_PROVIDER_SITE_OTHER): Payer: Medicare HMO | Admitting: Family Medicine

## 2020-07-20 ENCOUNTER — Other Ambulatory Visit: Payer: Self-pay

## 2020-07-20 VITALS — BP 124/70 | HR 72 | Temp 98.0°F | Resp 18 | Ht 71.0 in | Wt 171.0 lb

## 2020-07-20 DIAGNOSIS — M5412 Radiculopathy, cervical region: Secondary | ICD-10-CM

## 2020-07-20 DIAGNOSIS — R208 Other disturbances of skin sensation: Secondary | ICD-10-CM

## 2020-07-20 DIAGNOSIS — E785 Hyperlipidemia, unspecified: Secondary | ICD-10-CM | POA: Diagnosis not present

## 2020-07-20 DIAGNOSIS — I10 Essential (primary) hypertension: Secondary | ICD-10-CM

## 2020-07-25 ENCOUNTER — Telehealth: Payer: Self-pay

## 2020-07-25 NOTE — Progress Notes (Addendum)
    Chronic Care Management Pharmacy Assistant   Name: Clifford Houston  MRN: [id number] DOB: [birth year]  Reason for Encounter: Medication Review/General Adherence  PCP : Marin Olp, MD  Allergies:   Allergies  Allergen Reactions   Amlodipine     Rash in between webs of fingers, neck. Also hand burning sensation    Lisinopril     Mild cough    Medications: Outpatient Encounter Medications as of 07/25/2020  Medication Sig   amLODipine (NORVASC) 10 MG tablet Take 10 mg by mouth daily.   co-enzyme Q-10 30 MG capsule Take 300 mg by mouth daily.    fish oil-omega-3 fatty acids 1000 MG capsule Take 2 g by mouth daily.     Multiple Vitamin (MULTIVITAMIN) tablet Take 1 tablet by mouth 3 (three) times a week.    Red Yeast Rice Extract (RED YEAST RICE PO) Take 600 mg by mouth 4 (four) times a week.    No facility-administered encounter medications on file as of 07/25/2020.    Current Diagnosis: Patient Active Problem List   Diagnosis Date Noted   Burning sensation 07/20/2020   Cervical radiculopathy 11/20/2017   Multiple thyroid nodules 08/13/2017   History of thyrotoxicosis 08/13/2017   Hyperglycemia 11/19/2016   Former smoker 11/14/2014   History of skin cancer 11/07/2014   Hyperlipidemia 05/05/2007   Essential hypertension 05/05/2007   GERD 05/05/2007     Have you had any problems recently with your health?  Patient states he has had reaction to medication problems since June 2021. Have you had any problems with your pharmacy? Patient states he does not have any problems with his pharmacy.  What issues or side effects are you having with your medications? Patient has been dealing with a rash, burning sensation on hands since June 2021. Patient believes it has to do with medications. He has been seen by Dr. Yong Channel a couple times regarding this matter. Patient states he stopped amlodipine and the itching continued. Patient states he was instructed by PCP to stop  amlodipine unless BP was high. Patient states he is now back on amlodipine due to high BP.  Patient states he was prescribed red yeast rice but it did not improve  his symptoms. Patient states he also saw dermatologist and was prescribed a cream and it helped a little.   What would you like me to pass along to [name],[facility] for them to help you with?  Patient stated  He is having what he believes is a reaction to medications, patient states he is unsure of which medication it is and would like to speak to pharmacist.   Georgiana Shore ,[place] Pharmacist Assistant [phone]  Follow-Up:  Pharmacist Review Spoke w/ patient - Redness/irritation has improved, not needing to use steroid cream at this time. Feel dry skin may be contributing. BP at home is under 155/95, typically 140s/80s. No questions or other concerns at this time,

## 2020-08-03 DIAGNOSIS — K641 Second degree hemorrhoids: Secondary | ICD-10-CM | POA: Diagnosis not present

## 2020-08-03 DIAGNOSIS — K5730 Diverticulosis of large intestine without perforation or abscess without bleeding: Secondary | ICD-10-CM | POA: Diagnosis not present

## 2020-08-03 DIAGNOSIS — Z86010 Personal history of colonic polyps: Secondary | ICD-10-CM | POA: Diagnosis not present

## 2020-08-07 DIAGNOSIS — R69 Illness, unspecified: Secondary | ICD-10-CM | POA: Diagnosis not present

## 2020-09-05 ENCOUNTER — Encounter: Payer: Self-pay | Admitting: Family Medicine

## 2020-09-15 ENCOUNTER — Telehealth: Payer: Self-pay

## 2020-09-15 NOTE — Progress Notes (Signed)
    Chronic Care Management Pharmacy Assistant   Name: Clifford Houston  MRN: [id number] DOB: January 06, 1946  Reason for Encounter: Disease State  PCP : Marin Olp, MD  Allergies:   Allergies  Allergen Reactions  . Amlodipine     Rash in between webs of fingers, neck. Also hand burning sensation   . Lisinopril     Mild cough    Medications: Outpatient Encounter Medications as of 09/15/2020  Medication Sig  . amLODipine (NORVASC) 10 MG tablet Take 10 mg by mouth daily.  Marland Kitchen co-enzyme Q-10 30 MG capsule Take 300 mg by mouth daily.   . fish oil-omega-3 fatty acids 1000 MG capsule Take 2 g by mouth daily.    . Multiple Vitamin (MULTIVITAMIN) tablet Take 1 tablet by mouth 3 (three) times a week.   . Red Yeast Rice Extract (RED YEAST RICE PO) Take 600 mg by mouth 4 (four) times a week.    No facility-administered encounter medications on file as of 09/15/2020.    Current Diagnosis: Patient Active Problem List   Diagnosis Date Noted  . Burning sensation 07/20/2020  . Cervical radiculopathy 11/20/2017  . Multiple thyroid nodules 08/13/2017  . History of thyrotoxicosis 08/13/2017  . Hyperglycemia 11/19/2016  . Former smoker 11/14/2014  . History of skin cancer 11/07/2014  . Hyperlipidemia 05/05/2007  . Essential hypertension 05/05/2007  . GERD 05/05/2007     Have you had any problems recently with your health?               Patient has not had any problems recently with health. Patient stated he was having itching on hands and feet but since taking medication he has felt better.  Have you had any problems with your pharmacy?             Patient states he has no problems with pharmacy . Patient states everything comes in the mail.   What issues or side effects are you having with your medications?             Patient states he has no side effects from medications. Patient stated he stopped taking his statin medication due to it causing itching. Patient stated he would like  to avoid taking prednsione. Patient also stated he would love to get off blood pressure medications one day.  What would you like me to pass along to [name],[facility] for them to help you with?                Patient stated not at this time.  What can we do to take care of you better?            Patient stated not at this time.   Georgiana Shore ,[place] Pharmacist Assistant [phone]   Follow-Up:  Pharmacist Review

## 2020-09-26 ENCOUNTER — Telehealth: Payer: Self-pay | Admitting: Family Medicine

## 2020-09-26 NOTE — Telephone Encounter (Signed)
Left message for patient to call back and schedule Medicare Annual Wellness Visit (AWV) either virtually OR in office.   Last AWV 06/23/19; please schedule at anytime with [facility]-Nurse Health Advisor at [facility].  This should be a 45 minute visit.

## 2020-10-10 DIAGNOSIS — Z03818 Encounter for observation for suspected exposure to other biological agents ruled out: Secondary | ICD-10-CM | POA: Diagnosis not present

## 2020-10-16 ENCOUNTER — Ambulatory Visit (INDEPENDENT_AMBULATORY_CARE_PROVIDER_SITE_OTHER): Payer: Medicare HMO

## 2020-10-16 ENCOUNTER — Other Ambulatory Visit: Payer: Self-pay

## 2020-10-16 DIAGNOSIS — Z0000 Encounter for general adult medical examination without abnormal findings: Secondary | ICD-10-CM

## 2020-10-16 NOTE — Progress Notes (Signed)
Virtual Visit via Telephone Note  I connected with  Clifford Houston on 10/16/20 at 10:15 AM EST by telephone and verified that I am speaking with the correct person using two identifiers.  Medicare Annual Wellness visit completed telephonically due to Covid-19 pandemic.   Persons participating in this call: This Health Coach and this patient.   Location: Patient: Home Provider: Office   I discussed the limitations, risks, security and privacy concerns of performing an evaluation and management service by telephone and the availability of in person appointments. The patient expressed understanding and agreed to proceed.  Unable to perform video visit due to video visit attempted and failed and/or patient does not have video capability.   Some vital signs may be absent or patient reported.   Marzella Schlein, LPN    Subjective:   Clifford Houston is a 75 y.o. male who presents for Medicare Annual/Subsequent preventive examination.  Review of Systems     Cardiac Risk Factors include: advanced age (>65men, >85 women);hypertension;dyslipidemia;male gender     Objective:    There were no vitals filed for this visit. There is no height or weight on file to calculate BMI.  Advanced Directives 10/16/2020 06/23/2019 06/10/2018  Does Patient Have a Medical Advance Directive? Yes Yes Yes  Type of Estate agent of [place];Living will Living will;Healthcare Power of [employer] Power of [place];Living will  Does patient want to make changes to medical advance directive? - No - Patient declined No - Patient declined  Copy of Healthcare Power of Attorney in Chart? Yes - validated most recent copy scanned in chart (See row information) Yes - validated most recent copy scanned in chart (See row information) No - copy requested    Current Medications (verified) Outpatient Encounter Medications as of 10/16/2020  Medication Sig  . amLODipine (NORVASC) 10 MG tablet  Take 10 mg by mouth daily.  Marland Kitchen co-enzyme Q-10 30 MG capsule Take 300 mg by mouth daily.  . fish oil-omega-3 fatty acids 1000 MG capsule Take 2 g by mouth daily.  . Multiple Vitamin (MULTIVITAMIN) tablet Take 1 tablet by mouth 3 (three) times a week.  . chlorhexidine (PERIDEX) 0.12 % solution SMARTSIG:By Mouth  . [DISCONTINUED] Red Yeast Rice Extract (RED YEAST RICE PO) Take 600 mg by mouth 4 (four) times a week.    No facility-administered encounter medications on file as of 10/16/2020.    Allergies (verified) Lisinopril and Rosuvastatin   History: Past Medical History:  Diagnosis Date  . GERD (gastroesophageal reflux disease)   . Hyperlipidemia   . Hypertension   . THYROID NODULE 05/05/2007   Multiple nodules. Biopsy 2007 hyperplastic. No further follow up planned.     Past Surgical History:  Procedure Laterality Date  . CATARACT EXTRACTION, BILATERAL     dec 6th 2018 right eye, dec 20th 2018 left eye  . THYROID SURGERY     biopsy 2007 benign   Family History  Problem Relation Age of Onset  . Heart disease Father 46       smoker  . Thyroid disease Father        goiter  . Hypertension Sister   . Asthma Sister   . Emphysema Sister   . Heart attack Brother 51       did not eat well, no medical care  . Hypertension Brother   . Heart disease Brother   . Diabetes Mother   . Stroke Mother   . Diabetes Sister   .  Thyroid disease Sister   . Hypertension Sister   . Hypertension Brother    Social History   Socioeconomic History  . Marital status: Married    Spouse name: Not on file  . Number of children: Not on file  . Years of education: Not on file  . Highest education level: Not on file  Occupational History  . Occupation: Retired   Tobacco Use  . Smoking status: Former Smoker    Packs/day: 1.00    Years: 6.00    Pack years: 6.00    Types: Cigarettes    Quit date: 10/14/1965    Years since quitting: 55.0  . Smokeless tobacco: Never Used  Vaping Use  . Vaping  Use: Never used  Substance and Sexual Activity  . Alcohol use: Yes    Alcohol/week: 1.0 standard drink    Types: 1 Standard drinks or equivalent per week  . Drug use: No  . Sexual activity: Not on file  Other Topics Concern  . Not on file  Social History Narrative   Married (wife patient of Dr. Regis Bill). 2 sons. 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       Hobbies: golf-runs church golf group, go see grandkids. Walk 3 miles alternating days with going to [facility]   Social Determinants of Health   Financial Resource Strain: Low Risk   . Difficulty of Paying Living Expenses: Not hard at all  Food Insecurity: No Food Insecurity  . Worried About Charity fundraiser in the Last Year: Never true  . Ran Out of Food in the Last Year: Never true  Transportation Needs: No Transportation Needs  . Lack of Transportation (Medical): No  . Lack of Transportation (Non-Medical): No  Physical Activity: Sufficiently Active  . Days of Exercise per Week: 5 days  . Minutes of Exercise per Session: 60 min  Stress: No Stress Concern Present  . Feeling of Stress : Not at all  Social Connections: Moderately Integrated  . Frequency of Communication with Friends and Family: More than three times a week  . Frequency of Social Gatherings with Friends and Family: Once a week  . Attends Religious Services: Never  . Active Member of Clubs or Organizations: Yes  . Attends Archivist Meetings: 1 to 4 times per year  . Marital Status: Married    Tobacco Counseling Counseling given: Not Answered   Clinical Intake:  Pre-visit preparation completed: Yes  Pain : No/denies pain     BMI - recorded: 23.86 Nutritional Status: BMI of 19-24  Normal Nutritional Risks: None Diabetes: No  How often do you need to have someone help you when you  read instructions, pamphlets, or other written materials from your doctor or pharmacy?: 1 - Never  Diabetic?No  Interpreter Needed?: No  Information entered by :: Charlott Rakes, LPN   Activities of Daily Living In your present state of health, do you have any difficulty performing the following activities: 10/16/2020 07/20/2020  Hearing? N N  Vision? N N  Difficulty concentrating or making decisions? N N  Walking or climbing stairs? N N  Dressing or bathing? N N  Doing errands, shopping? N N  Preparing Food and eating ? N -  Using the Toilet? N -  In the past six months, have you accidently leaked urine? Y -  Comment AT  TIMES -  Do you have problems with loss of bowel control? N -  Managing your Medications? N -  Managing your Finances? N -  Housekeeping or managing your Housekeeping? N -  Some recent data might be hidden    Patient Care Team: Marin Olp, MD as PCP - General (Family Medicine) Juanita Craver, MD as Attending Physician (Gastroenterology) Philemon Kingdom, MD as Consulting Physician (Endocrinology) Madelin Rear, [facility] as Pharmacist (Pharmacist)  Indicate any recent Medical Services you may have received from other than [facility] providers in the past year (date may be approximate).     Assessment:   This is a routine wellness examination for Jaconita.  Hearing/Vision screen  Hearing Screening   125Hz  250Hz  500Hz  1000Hz  2000Hz  3000Hz  4000Hz  6000Hz  8000Hz   Right ear:           Left ear:           Comments: Pt denies any hearing issues   Vision Screening Comments: Pt follows Dr Ellie Lunch for annual eye exams  Dietary issues and exercise activities discussed: Current Exercise Habits: Home exercise routine, Type of exercise: strength training/weights;stretching;walking, Time (Minutes): > 60, Frequency (Times/Week): 5, Weekly Exercise (Minutes/Week): 0  Goals    . Increase physical activity     Maintain active life style working out at [employer] and drinking water     . Patient Stated     Lose a few pounds and continue what I'm doing     . PharmD Care Plan     CARE PLAN ENTRY (see longitudinal plan of care for additional care plan information)  Current Barriers:  . Chronic Disease Management support, education, and care coordination needs related to Hypertension and Hyperlipidemia   Hypertension BP Readings from Last 3 Encounters:  01/28/20 138/80  06/23/19 134/78  11/25/18 132/80   . Pharmacist Clinical Goal(s): o Over the next 180 days, patient will work with PharmD and providers to achieve BP goal <130/80 . Current regimen:  o Amlodipine 10 mg once daily  . Interventions: o Home BP monitoring  . Patient self care activities - Over the next 180 days, patient will: o Check BP at least once every 1-2 weeks, document, and provide at future appointments o Ensure daily salt intake < 2300 mg/day  Hyperlipidemia Lab Results  Component Value Date/Time   LDLCALC 117 (H) 01/28/2020 09:09 AM   LDLDIRECT 109.0 06/29/2018 10:13 AM   . Pharmacist Clinical Goal(s): o Over the next 180 days, patient will work with PharmD and providers to achieve LDL goal < 100 . Current regimen:  o RYR 600 mg four times a week o Fish oil 2 grams daily . Interventions: o Discussed alternative lipid lowering medications such as repatha . Patient self care activities - Over the next 180 days, patient will: o Interior and spatial designer with any questions.    Medication management . Pharmacist Clinical Goal(s): o Over the next 180 days, patient will work with PharmD and providers to maintain optimal medication adherence . Current pharmacy: [facility] . Interventions o Comprehensive medication review performed. o Continue current medication management strategy . Patient self care activities - Over the next 180 days, patient will: o Focus on medication adherence by taking medications as prescribed o Report any questions or concerns to PharmD and/or  provider(s) Initial goal documentation.      Depression Screen PHQ 2/9 Scores 10/16/2020 05/29/2020 01/28/2020 06/23/2019 06/10/2018 11/20/2017 11/19/2016  PHQ - 2 Score 0 0 0 0 0 0 0  Fall Risk Fall Risk  10/16/2020 01/28/2020 06/23/2019 06/10/2018 11/20/2017  Falls in the past year? 0 0 0 No No  Number falls in past yr: 0 0 0 - -  Injury with Fall? 0 0 0 - -  Risk for fall due to : Impaired vision;Impaired balance/gait - - - -  Follow up Falls prevention discussed - Falls evaluation completed;Education provided - -    FALL RISK PREVENTION PERTAINING TO THE HOME:  Any stairs in or around the home? Yes  If so, are there any without handrails? No  Home free of loose throw rugs in walkways, pet beds, electrical cords, etc? Yes  Adequate lighting in your home to reduce risk of falls? Yes   ASSISTIVE DEVICES UTILIZED TO PREVENT FALLS:  Life alert? No  Use of a cane, walker or w/c? No  Grab bars in the bathroom? Yes  Shower chair or bench in shower? Yes  Elevated toilet seat or a handicapped toilet? Yes   TIMED UP AND GO:  Was the test performed? No .      Cognitive Function:     6CIT Screen 10/16/2020  What Year? 0 points  What month? 0 points  Count back from 20 0 points  Months in reverse 0 points  Repeat phrase 0 points    Immunizations Immunization History  Administered Date(s) Administered  . Influenza Split 07/14/2012, 06/28/2013  . Influenza Whole 07/14/2019  . Influenza, High Dose Seasonal PF 06/29/2018, 07/17/2020  . Influenza-Unspecified 07/17/2014, 06/15/2015, 07/31/2016, 06/30/2017  . PFIZER SARS-COV-2 Vaccination 11/11/2019, 12/09/2019, 06/23/2020  . Pneumococcal Conjugate-13 11/07/2014  . Pneumococcal Polysaccharide-23 10/06/2012  . Tdap 01/08/2010, 02/22/2020  . Zoster 01/08/2010  . Zoster Recombinat (Shingrix) 10/26/2018, 03/23/2019    TDAP status: Up to date  Flu Vaccine status: Up to date  Done 07/17/20 Pneumococcal vaccine status: Up to date  Covid-19  vaccine status: Completed vaccines  Qualifies for Shingles Vaccine? Yes   Zostavax completed Yes   Shingrix Completed?: Yes  Screening Tests Health Maintenance  Topic Date Due  . COLONOSCOPY (Pts 45-12yrs Insurance coverage will need to be confirmed)  03/05/2023  . TETANUS/TDAP  02/21/2030  . INFLUENZA VACCINE  Completed  . COVID-19 Vaccine  Completed  . PNA vac Low Risk Adult  Completed  . Hepatitis C Screening  Addressed    Health Maintenance  There are no preventive care reminders to display for this patient.  Colorectal cancer screening: Type of screening: Colonoscopy. Completed 03/04/18. Repeat every 5 years  Additional Screening:  Hepatitis C Screening:  Completed 11/15/15  Vision Screening: Recommended annual ophthalmology exams for early detection of glaucoma and other disorders of the eye. Is the patient up to date with their annual eye exam?  Yes  Who is the provider or what is the name of the office in which the patient attends annual eye exams? Dr Ellie Lunch   Dental Screening: Recommended annual dental exams for proper oral hygiene  Community Resource Referral / Chronic Care Management: CRR required this visit?  No   CCM required this visit?  No      Plan:     I have personally reviewed and noted the following in the patient's chart:   . Medical and social history . Use of alcohol, tobacco or illicit drugs  . Current medications and supplements . Functional ability and status . Nutritional status . Physical activity . Advanced directives . List of other physicians . Hospitalizations, surgeries, and ER visits in previous 12 months . Vitals .  Screenings to include cognitive, depression, and falls . Referrals and appointments  In addition, I have reviewed and discussed with patient certain preventive protocols, quality metrics, and best practice recommendations. A written personalized care plan for preventive services as well as general preventive health  recommendations were provided to patient.     Willette Brace, LPN   [phone]   Nurse Notes: None

## 2020-10-16 NOTE — Patient Instructions (Addendum)
Clifford Houston , Thank you for taking time to come for your Medicare Wellness Visit. I appreciate your ongoing commitment to your health goals. Please review the following plan we discussed and let me know if I can assist you in the future.   Screening recommendations/referrals: Colonoscopy: Done 03/04/18 Recommended yearly ophthalmology/optometry visit for glaucoma screening and checkup Recommended yearly dental visit for hygiene and checkup  Vaccinations: Influenza vaccine: Done 07/17/20 Up to date Pneumococcal vaccine: Up to date Tdap vaccine: Up to date Shingles vaccine: Done 1/13 & 03/23/19   Covid-19: Done 1/28, 2/25, & 06/23/20  Advanced directives: Copies in chart  Conditions/risks identified: Lose a few pounds and keep doing what I'm doing  Next appointment: Follow up in one year for your annual wellness visit.   Preventive Care 74 Years and Older, Male Preventive care refers to lifestyle choices and visits with your health care provider that can promote health and wellness. What does preventive care include?  A yearly physical exam. This is also called an annual well check.  Dental exams once or twice a year.  Routine eye exams. Ask your health care provider how often you should have your eyes checked.  Personal lifestyle choices, including:  Daily care of your teeth and gums.  Regular physical activity.  Eating a healthy diet.  Avoiding tobacco and drug use.  Limiting alcohol use.  Practicing safe sex.  Taking low doses of aspirin every day.  Taking vitamin and mineral supplements as recommended by your health care provider. What happens during an annual well check? The services and screenings done by your health care provider during your annual well check will depend on your age, overall health, lifestyle risk factors, and family history of disease. Counseling  Your health care provider may ask you questions about your:  Alcohol use.  Tobacco use.  Drug  use.  Emotional well-being.  Home and relationship well-being.  Sexual activity.  Eating habits.  History of falls.  Memory and ability to understand (cognition).  Work and work Astronomer. Screening  You may have the following tests or measurements:  Height, weight, and BMI.  Blood pressure.  Lipid and cholesterol levels. These may be checked every 5 years, or more frequently if you are over 65 years old.  Skin check.  Lung cancer screening. You may have this screening every year starting at age 74 if you have a 30-pack-year history of smoking and currently smoke or have quit within the past 15 years.  Fecal occult blood test (FOBT) of the stool. You may have this test every year starting at age 44.  Flexible sigmoidoscopy or colonoscopy. You may have a sigmoidoscopy every 5 years or a colonoscopy every 10 years starting at age 81.  Prostate cancer screening. Recommendations will vary depending on your family history and other risks.  Hepatitis C blood test.  Hepatitis B blood test.  Sexually transmitted disease (STD) testing.  Diabetes screening. This is done by checking your blood sugar (glucose) after you have not eaten for a while (fasting). You may have this done every 1-3 years.  Abdominal aortic aneurysm (AAA) screening. You may need this if you are a current or former smoker.  Osteoporosis. You may be screened starting at age 56 if you are at high risk. Talk with your health care provider about your test results, treatment options, and if necessary, the need for more tests. Vaccines  Your health care provider may recommend certain vaccines, such as:  Influenza vaccine. This is  recommended every year.  Tetanus, diphtheria, and acellular pertussis (Tdap, Td) vaccine. You may need a Td booster every 10 years.  Zoster vaccine. You may need this after age 38.  Pneumococcal 13-valent conjugate (PCV13) vaccine. One dose is recommended after age  62.  Pneumococcal polysaccharide (PPSV23) vaccine. One dose is recommended after age 50. Talk to your health care provider about which screenings and vaccines you need and how often you need them. This information is not intended to replace advice given to you by your health care provider. Make sure you discuss any questions you have with your health care provider. Document Released: 10/27/2015 Document Revised: 06/19/2016 Document Reviewed: 08/01/2015 Elsevier Interactive Patient Education  2017 [place] Prevention in the Home Falls can cause injuries. They can happen to people of all ages. There are many things you can do to make your home safe and to help prevent falls. What can I do on the outside of my home?  Regularly fix the edges of walkways and driveways and fix any cracks.  Remove anything that might make you trip as you walk through a door, such as a raised step or threshold.  Trim any bushes or trees on the path to your home.  Use bright outdoor lighting.  Clear any walking paths of anything that might make someone trip, such as rocks or tools.  Regularly check to see if handrails are loose or broken. Make sure that both sides of any steps have handrails.  Any raised decks and porches should have guardrails on the edges.  Have any leaves, snow, or ice cleared regularly.  Use sand or salt on walking paths during winter.  Clean up any spills in your garage right away. This includes oil or grease spills. What can I do in the bathroom?  Use night lights.  Install grab bars by the toilet and in the tub and shower. Do not use towel bars as grab bars.  Use non-skid mats or decals in the tub or shower.  If you need to sit down in the shower, use a plastic, non-slip stool.  Keep the floor dry. Clean up any water that spills on the floor as soon as it happens.  Remove soap buildup in the tub or shower regularly.  Attach bath mats securely with double-sided  non-slip rug tape.  Do not have throw rugs and other things on the floor that can make you trip. What can I do in the bedroom?  Use night lights.  Make sure that you have a light by your bed that is easy to reach.  Do not use any sheets or blankets that are too big for your bed. They should not hang down onto the floor.  Have a firm chair that has side arms. You can use this for support while you get dressed.  Do not have throw rugs and other things on the floor that can make you trip. What can I do in the kitchen?  Clean up any spills right away.  Avoid walking on wet floors.  Keep items that you use a lot in easy-to-reach places.  If you need to reach something above you, use a strong step stool that has a grab bar.  Keep electrical cords out of the way.  Do not use floor polish or wax that makes floors slippery. If you must use wax, use non-skid floor wax.  Do not have throw rugs and other things on the floor that can make you trip.  What can I do with my stairs?  Do not leave any items on the stairs.  Make sure that there are handrails on both sides of the stairs and use them. Fix handrails that are broken or loose. Make sure that handrails are as long as the stairways.  Check any carpeting to make sure that it is firmly attached to the stairs. Fix any carpet that is loose or worn.  Avoid having throw rugs at the top or bottom of the stairs. If you do have throw rugs, attach them to the floor with carpet tape.  Make sure that you have a light switch at the top of the stairs and the bottom of the stairs. If you do not have them, ask someone to add them for you. What else can I do to help prevent falls?  Wear shoes that:  Do not have high heels.  Have rubber bottoms.  Are comfortable and fit you well.  Are closed at the toe. Do not wear sandals.  If you use a stepladder:  Make sure that it is fully opened. Do not climb a closed stepladder.  Make sure that both  sides of the stepladder are locked into place.  Ask someone to hold it for you, if possible.  Clearly mark and make sure that you can see:  Any grab bars or handrails.  First and last steps.  Where the edge of each step is.  Use tools that help you move around (mobility aids) if they are needed. These include:  Canes.  Walkers.  Scooters.  Crutches.  Turn on the lights when you go into a dark area. Replace any light bulbs as soon as they burn out.  Set up your furniture so you have a clear path. Avoid moving your furniture around.  If any of your floors are uneven, fix them.  If there are any pets around you, be aware of where they are.  Review your medicines with your doctor. Some medicines can make you feel dizzy. This can increase your chance of falling. Ask your doctor what other things that you can do to help prevent falls. This information is not intended to replace advice given to you by your health care provider. Make sure you discuss any questions you have with your health care provider. Document Released: 07/27/2009 Document Revised: 03/07/2016 Document Reviewed: 11/04/2014 Elsevier Interactive Patient Education  2017 [employer].

## 2020-11-14 ENCOUNTER — Other Ambulatory Visit: Payer: Self-pay | Admitting: Dermatology

## 2020-11-14 DIAGNOSIS — D0439 Carcinoma in situ of skin of other parts of face: Secondary | ICD-10-CM | POA: Diagnosis not present

## 2020-11-14 DIAGNOSIS — D485 Neoplasm of uncertain behavior of skin: Secondary | ICD-10-CM | POA: Diagnosis not present

## 2020-11-15 LAB — DERMPATH, SPECIMEN A

## 2020-11-15 LAB — DERMATOPATHOLOGY REPORT

## 2020-11-16 IMAGING — CT CT CARDIAC CORONARY ARTERY CALCIUM SCORE
3 series · 14 of 20 positions shown, 15 images · non-contrast
Comparison: None.
COMPARISON: None.

Addendum:
EXAM:
OVER-READ INTERPRETATION  CT CHEST

The following report is an over-read performed by radiologist Dr.
Mohammed Hafiz Brentu [REDACTED] on 02/08/2020. This over-read
does not include interpretation of cardiac or coronary anatomy or
pathology. The coronary calcium score interpretation by the
cardiologist is attached.
CLINICAL DATA: Risk stratification
Coronary Calcium Score
TECHNIQUE: The patient was scanned on a Siemens Somatom 64 slice scanner. Axial
non-contrast 3 mm slices were carried out through the heart. The
data set was analyzed on a dedicated work station and scored using
the Agatson method.

[Series 2: casc 3.0 bv41 2 bestdiast 69 % · axial · 0.35mm/px · z∈[-278,-188]mm · 4 of 50 slices shown, 5 images]
[im 10/50  vessel]
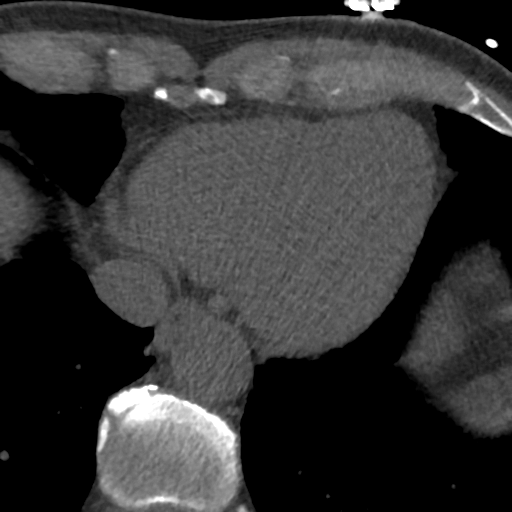
[im 10/50  lung]
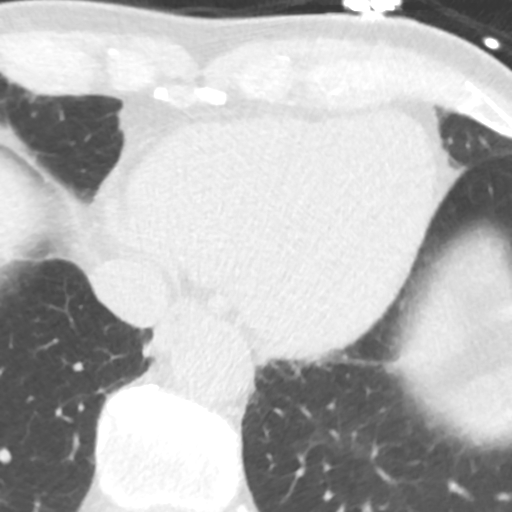
[im 20/50  vessel]
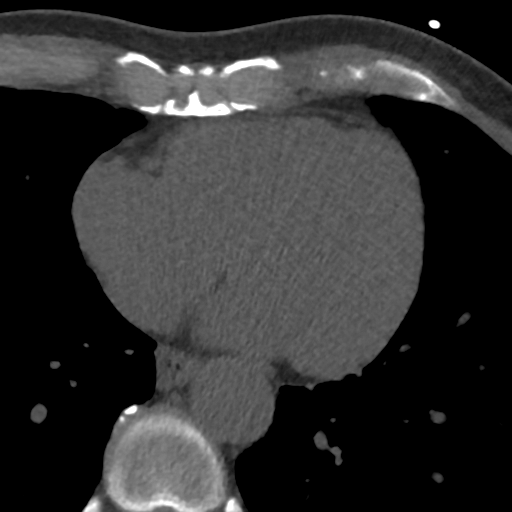
[im 30/50  vessel]
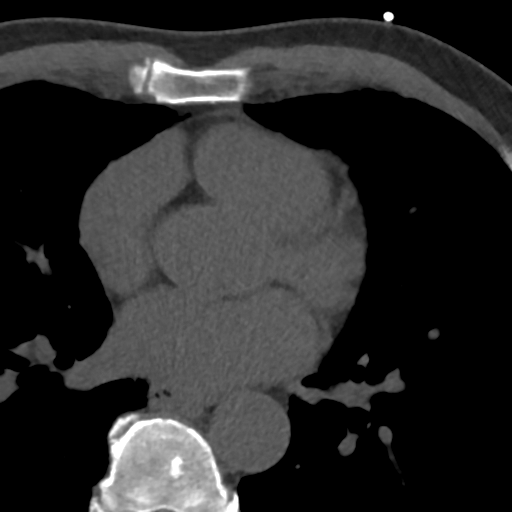
[im 40/50  vessel]
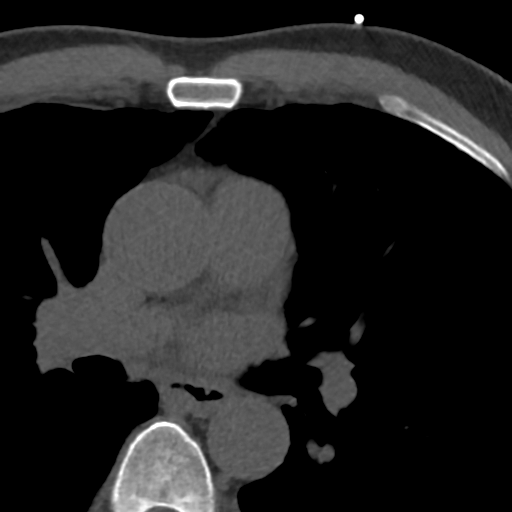

[Series 3: lung 70 % · axial · 0.71mm/px · z∈[-280,-184]mm · 5 of 50 slices shown]
[im 9/50  lung]
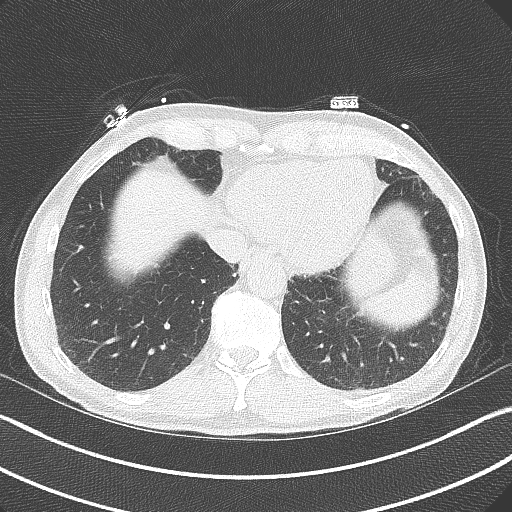
[im 17/50  lung]
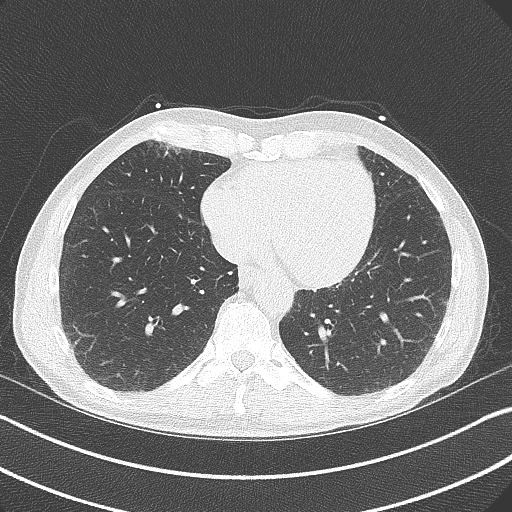
[im 25/50  lung]
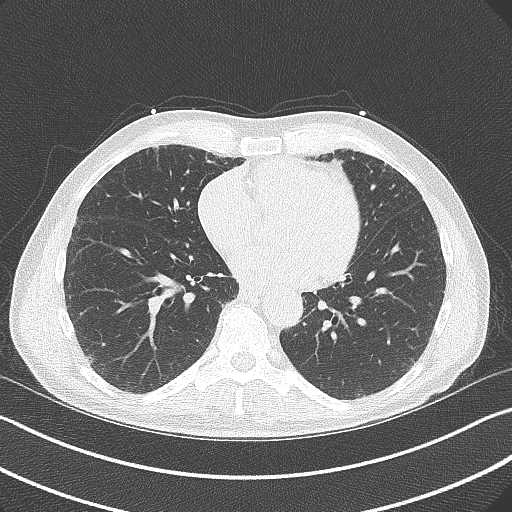
[im 33/50  lung]
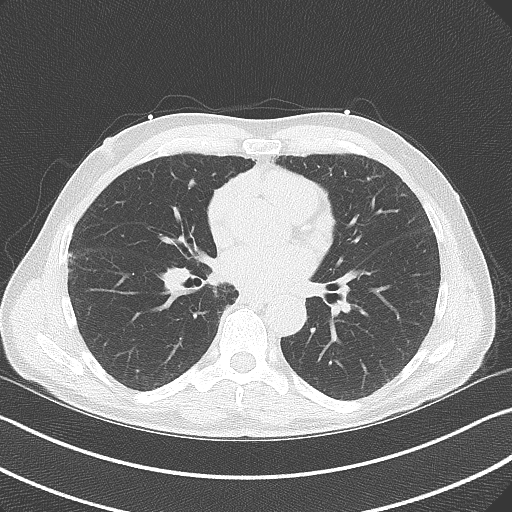
[im 41/50  lung]
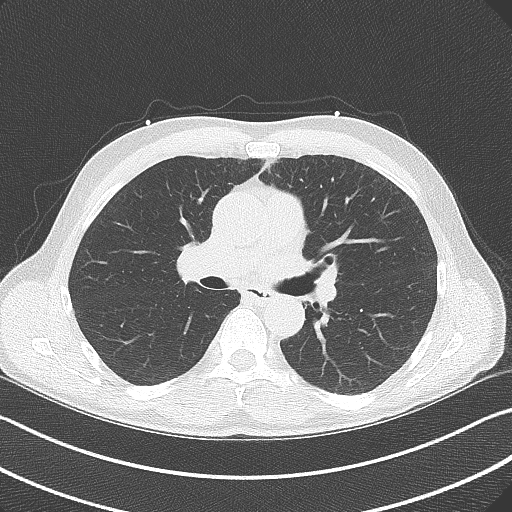

[Series 4: lung st 70 % · axial · 0.71mm/px · z∈[-280,-184]mm · 5 of 50 slices shown]
[im 9/50  lung]
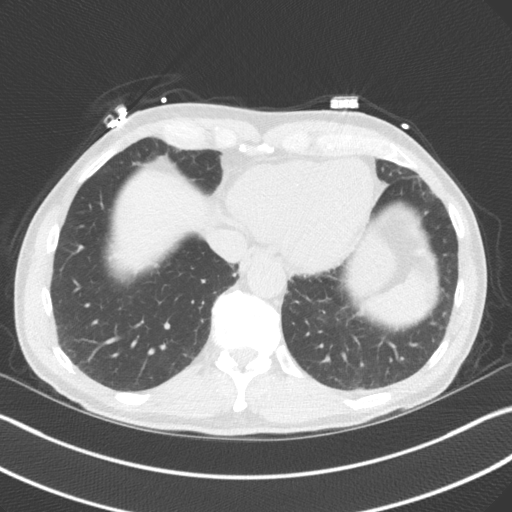
[im 17/50  lung]
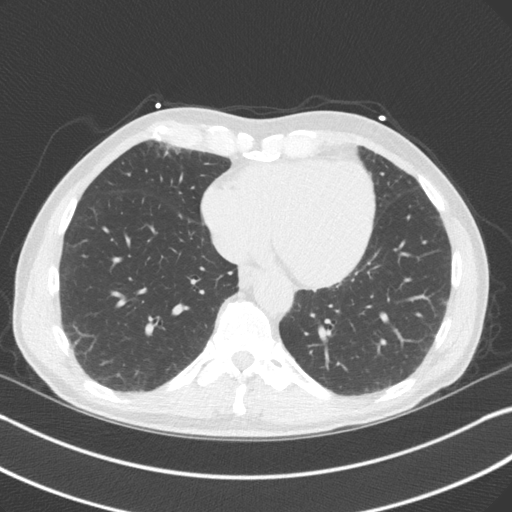
[im 25/50  lung]
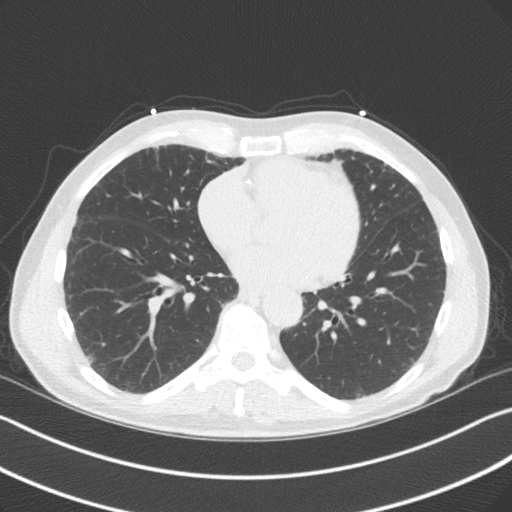
[im 33/50  lung]
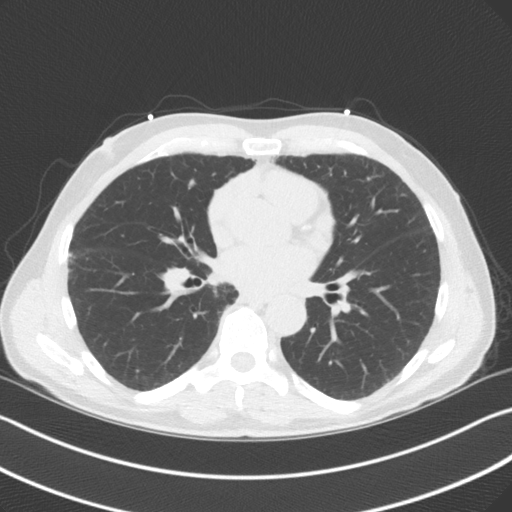
[im 41/50  lung]
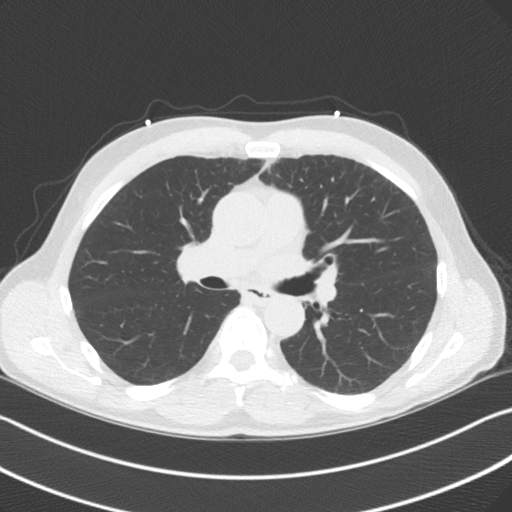

[14 of 20 positions shown; findings below may reference images not displayed]

FINDINGS: Vascular: Heart is normal size.  Aorta is normal caliber.

Mediastinum/Nodes: No adenopathy in the lower mediastinum or hila.

Lungs/Pleura: No confluent opacities or effusions.

Upper Abdomen: Imaging into the upper abdomen shows no acute
findings.

Musculoskeletal: Chest wall soft tissues are unremarkable. No acute
bony abnormality.
IMPRESSION: No acute or significant extracardiac abnormality.
FINDINGS: Non-cardiac: See separate report from [REDACTED].

Ascending aorta: Normal size

Pericardium: Normal

Coronary arteries: Normal origin
IMPRESSION: Coronary calcium score of 48.6. This was 29 percentile for age and
sex matched control.

Khmoussi Nikita

*** End of Addendum ***
EXAM:
OVER-READ INTERPRETATION  CT CHEST

The following report is an over-read performed by radiologist Dr.
Mohammed Hafiz Brentu [REDACTED] on 02/08/2020. This over-read
does not include interpretation of cardiac or coronary anatomy or
pathology. The coronary calcium score interpretation by the
cardiologist is attached.
FINDINGS: Vascular: Heart is normal size.  Aorta is normal caliber.

Mediastinum/Nodes: No adenopathy in the lower mediastinum or hila.

Lungs/Pleura: No confluent opacities or effusions.

Upper Abdomen: Imaging into the upper abdomen shows no acute
findings.

Musculoskeletal: Chest wall soft tissues are unremarkable. No acute
bony abnormality.
IMPRESSION: No acute or significant extracardiac abnormality.

## 2020-11-26 DIAGNOSIS — Z20822 Contact with and (suspected) exposure to covid-19: Secondary | ICD-10-CM | POA: Diagnosis not present

## 2020-11-27 ENCOUNTER — Ambulatory Visit (INDEPENDENT_AMBULATORY_CARE_PROVIDER_SITE_OTHER): Payer: Medicare HMO

## 2020-11-27 DIAGNOSIS — E785 Hyperlipidemia, unspecified: Secondary | ICD-10-CM | POA: Diagnosis not present

## 2020-11-27 DIAGNOSIS — I10 Essential (primary) hypertension: Secondary | ICD-10-CM

## 2020-11-27 NOTE — Patient Instructions (Signed)
Mr. Machia,  Thank you for taking the time to review your medications with me today.  I have included our care plan/goals in the following pages. Please review and call me at [phone] with any questions!  Thanks! Ellin Mayhew, Pharm.D., BCGP Clinical Pharmacist [facility] [place] [phone] Patient Care Plan: [place] Plan    Problem Identified: HLD HTN     Long-Range Goal: Disease management   Start Date: 11/27/2020  Expected End Date: 11/27/2021  This Visit's Progress: On track  Priority: High  Note:    Medication Assistance: None required.  Patient affirms current coverage meets needs. Patient's preferred pharmacy is:  [facility] [place] [place] [facility] [address] [place] [address] Phone: [phone] Fax: [phone]  [id number] [place], [place] [address] [place] [address] Phone: [phone] Fax: [phone]  We discussed: Benefits of medication synchronization, packaging and delivery as well as enhanced pharmacist oversight with [employer].. Patient decided to: Continue current medication management strategy Patient agrees to Care Plan and Follow-up 10-12 months.  Current Barriers:  . n/a.  Pharmacist Clinical Goal(s):  Marland Kitchen Over the next 365 days, patient will verbalize ability to afford treatment regimen . achieve adherence to monitoring guidelines and medication adherence to achieve therapeutic efficacy . contact provider office for questions/concerns as evidenced notation of same in electronic health record through collaboration with PharmD and provider.   Interventions: . 1:1 collaboration with Marin Olp, MD regarding development and update of comprehensive plan of care as evidenced by provider attestation and co-signature . Inter-disciplinary care team  collaboration (see longitudinal plan of care) . Comprehensive medication review performed; medication list updated in electronic medical record .  Hypertension (BP goal <140/90) -controlled -Current treatment: . Amlodipine 10 mg once daily  -Medications previously tried: irbesartan (concern of itchiness - would consider retrial), lisinopril  -Current home readings: 140/80s or less -Current exercise habits: exercise/stretches for back several days per week. Getting back into routine with walking- father in law passed last Friday 11/24/2020. -Denies hypotensive/hypertensive symptoms -Educated on BP goals and benefits of medications for prevention of heart attack, stroke and kidney damage; Daily salt intake goal < 2300 mg; Exercise goal of 150 minutes per week; -Counseled to monitor BP at home at least weekly, document, and provide log at future appointments -Counseled on diet and exercise extensively Recommended to continue current medication  Hyperlipidemia: (LDL goal < 70) -uncontrolled -Current treatment: . No current prescriptions -Medications previously tried: rosuvastatin 5 mg (itchiness, rash), simvastatin 20 mg, RYR -Current exercise habits: see HTN -Educated on Cholesterol goals;  Benefits of statin for ASCVD risk reduction; -Counseled on diet and exercise extensively  Patient Goals/Self-Care Activities . Over the next 365 days, patient will:  - take medications as prescribed target a minimum of 150 minutes of moderate intensity exercise weekly      The patient verbalized understanding of instructions provided today and agreed to receive a MyChart copy of patient instruction and/or educational materials. Telephone follow up appointment with pharmacy team member scheduled for: See next appointment with "Care Management Staff" under "What's Next" below.

## 2020-11-27 NOTE — Progress Notes (Signed)
Chronic Care Management Pharmacy Note  11/27/2020 Name:  Clifford Houston MRN:  [id number] DOB:  11/01/1945  Subjective: [name] is an 75 y.o. year old male who is a primary patient of Hunter, Brayton Mars, MD.  The CCM team was consulted for assistance with disease management and care coordination needs.   Engaged with patient by telephone for follow up visit in response to provider referral for pharmacy case management and/or care coordination services.   Consent to Services:  The patient was given information about Chronic Care Management services, agreed to services, and gave verbal consent prior to initiation of services.  Please see initial visit note for detailed documentation.  Patient Care Team: Marin Olp, MD as PCP - General (Family Medicine) Juanita Craver, MD as Attending Physician (Gastroenterology) Philemon Kingdom, MD as Consulting Physician (Endocrinology) Madelin Rear, [facility] as Pharmacist (Pharmacist)  Objective: Lab Results  Component Value Date   CREATININE 1.08 07/17/2020   BUN 12 07/17/2020   GFR 64.33 01/28/2020   GFRNONAA 68 07/17/2020   GFRAA 78 07/17/2020   NA 137 07/17/2020   K 4.3 07/17/2020   CALCIUM 9.9 07/17/2020   CO2 27 07/17/2020   Lab Results  Component Value Date/Time   HGBA1C 5.6 01/28/2020 09:09 AM   HGBA1C 5.6 11/23/2018 08:56 AM   GFR 64.33 01/28/2020 09:09 AM   GFR 68.78 11/23/2018 08:56 AM    Last diabetic Eye exam: No results found for: HMDIABEYEEXA  Last diabetic Foot exam: No results found for: HMDIABFOOTEX  Lab Results  Component Value Date   CHOL 192 07/17/2020   HDL 52 07/17/2020   LDLCALC 121 (H) 07/17/2020   LDLDIRECT 109.0 06/29/2018   TRIG 91 07/17/2020   CHOLHDL 3.7 07/17/2020   Hepatic Function Latest Ref Rng & Units 01/28/2020 11/23/2018 11/20/2017  Total Protein 6.0 - 8.3 g/dL 7.6 7.2 7.4  Albumin 3.5 - 5.2 g/dL 4.8 4.6 4.5  AST 0 - 37 U/L $Remo'24 23 22  'pOcPH$ ALT 0 - 53 U/L $Remo'16 16 16  'yfwof$ Alk Phosphatase 39 - 117  U/L 53 49 49  Total Bilirubin 0.2 - 1.2 mg/dL 0.6 0.6 0.5  Bilirubin, Direct 0.0 - 0.3 mg/dL - - -   Lab Results  Component Value Date/Time   TSH 0.74 01/28/2020 09:09 AM   TSH 0.60 11/23/2018 08:56 AM   FREET4 0.86 01/28/2020 09:09 AM   FREET4 0.85 08/13/2017 09:07 AM   CBC Latest Ref Rng & Units 01/28/2020 11/23/2018 11/20/2017  WBC 4.0 - 10.5 K/uL 5.1 4.3 4.3  Hemoglobin 13.0 - 17.0 g/dL 14.5 14.4 13.7  Hematocrit 39.0 - 52.0 % 41.8 42.1 39.9  Platelets 150.0 - 400.0 K/uL 204.0 209.0 198.0   No results found for: VD25OH  Clinical ASCVD: No  The 10-year ASCVD risk score Mikey Bussing DC Jr., et al., 2013) is: 23.3%   Values used to calculate the score:     Age: 37 years     Sex: Male     Is Non-Hispanic African American: No     Diabetic: No     Tobacco smoker: No     Systolic Blood Pressure: 381 mmHg     Is BP treated: Yes     HDL Cholesterol: 52 mg/dL     Total Cholesterol: 192 mg/dL    Depression screen [facility] 2/9 10/16/2020 05/29/2020 01/28/2020  Decreased Interest 0 0 0  Down, Depressed, Hopeless 0 0 0  PHQ - 2 Score 0 0 0    Social History  Tobacco Use  Smoking Status Former Smoker  . Packs/day: 1.00  . Years: 6.00  . Pack years: 6.00  . Types: Cigarettes  . Quit date: 10/14/1965  . Years since quitting: 55.1  Smokeless Tobacco Never Used   BP Readings from Last 3 Encounters:  07/20/20 124/70  05/29/20 (!) 160/84  01/28/20 138/80   Pulse Readings from Last 3 Encounters:  07/20/20 72  05/29/20 73  01/28/20 95   Wt Readings from Last 3 Encounters:  07/20/20 171 lb (77.6 kg)  05/29/20 166 lb 6.4 oz (75.5 kg)  01/28/20 168 lb (76.2 kg)   Assessment/Interventions: Review of patient past medical history, allergies, medications, health status, including review of consultants reports, laboratory and other test data, was performed as part of comprehensive evaluation and provision of chronic care management services.   SDOH:  (Social Determinants of Health) assessments and  interventions performed: Yes  CCM Care Plan Allergies  Allergen Reactions  . Lisinopril     Mild cough  . Rosuvastatin     Itching and rash   Medications Reviewed Today    Reviewed by Madelin Rear, [facility] (Pharmacist) on 11/27/20 at 1316  Med List Status: <None>  Medication Order Taking? Sig Documenting Provider Last Dose Status Informant  amLODipine (NORVASC) 10 MG tablet [id number] Yes Take 10 mg by mouth daily. [provider] Taking Active   chlorhexidine (PERIDEX) 0.12 % solution [id number]  SMARTSIG:By Mouth [provider]  Active   co-enzyme Q-10 30 MG capsule [id number]  Take 300 mg by mouth daily. [provider]  Active   fish oil-omega-3 fatty acids 1000 MG capsule [id number] Yes Take 2 g by mouth daily. [provider] Taking Active   Multiple Vitamin (MULTIVITAMIN) tablet [id number] Yes Take 1 tablet by mouth 3 (three) times a week. [provider] Taking Active            Med Note Lonie Peak Oct 16, 2020 10:30 AM) Daily          Patient Active Problem List   Diagnosis Date Noted  . Burning sensation 07/20/2020  . Cervical radiculopathy 11/20/2017  . Multiple thyroid nodules 08/13/2017  . History of thyrotoxicosis 08/13/2017  . Hyperglycemia 11/19/2016  . Former smoker 11/14/2014  . History of skin cancer 11/07/2014  . Hyperlipidemia 05/05/2007  . Essential hypertension 05/05/2007  . GERD 05/05/2007   Immunization History  Administered Date(s) Administered  . Influenza Split 07/14/2012, 06/28/2013  . Influenza Whole 07/14/2019  . Influenza, High Dose Seasonal PF 06/29/2018, 07/17/2020  . Influenza-Unspecified 07/17/2014, 06/15/2015, 07/31/2016, 06/30/2017  . PFIZER(Purple Top)SARS-COV-2 Vaccination 11/11/2019, 12/09/2019, 06/23/2020  . Pneumococcal Conjugate-13 11/07/2014  . Pneumococcal Polysaccharide-23 10/06/2012  . Tdap 01/08/2010, 02/22/2020  . Zoster 01/08/2010  . Zoster Recombinat (Shingrix) 10/26/2018,  03/23/2019    Conditions to be addressed/monitored:  Hypertension and Hyperlipidemia Care Plan : [place]  Updates made by Madelin Rear, [facility] since 11/27/2020 12:00 AM    Problem: HLD HTN     Long-Range Goal: Disease management   Start Date: 11/27/2020  Expected End Date: 11/27/2021  This Visit's Progress: On track  Priority: High  Note:    Medication Assistance: None required.  Patient affirms current coverage meets needs. Patient's preferred pharmacy is:  [facility] [address] [place] [address] Phone: [phone] Fax: [phone]  [id number] [place], [address]  [place] [address] Phone: [phone] Fax: [phone]  We discussed: Benefits of medication synchronization, packaging and delivery as well as enhanced pharmacist oversight with [employer].. Patient decided to: Continue current medication management strategy Patient agrees to Care Plan and Follow-up 10-12 months.  Current Barriers:  . n/a.  Pharmacist Clinical Goal(s):  Marland Kitchen Over the next 365 days, patient will verbalize ability to afford treatment regimen . achieve adherence to monitoring guidelines and medication adherence to achieve therapeutic efficacy . contact provider office for questions/concerns as evidenced notation of same in electronic health record through collaboration with PharmD and provider.   Interventions: . 1:1 collaboration with Marin Olp, MD regarding development and update of comprehensive plan of care as evidenced by provider attestation and co-signature . Inter-disciplinary care team collaboration (see longitudinal plan of care) . Comprehensive medication review performed; medication list updated in electronic medical record .  Hypertension (BP goal <140/90) -controlled -Current treatment: . Amlodipine 10 mg  once daily  -Medications previously tried: irbesartan (concern of itchiness - would consider retrial), lisinopril  -Current home readings: 140/80s or less -Current exercise habits: exercise/stretches for back several days per week. Getting back into routine with walking- father in law passed last Friday 11/24/2020. -Denies hypotensive/hypertensive symptoms -Educated on BP goals and benefits of medications for prevention of heart attack, stroke and kidney damage; Daily salt intake goal < 2300 mg; Exercise goal of 150 minutes per week; -Counseled to monitor BP at home at least weekly, document, and provide log at future appointments -Counseled on diet and exercise extensively Recommended to continue current medication  Hyperlipidemia: (LDL goal < 70) -uncontrolled -Current treatment: . No current prescriptions -Medications previously tried: rosuvastatin 5 mg (itchiness, rash), simvastatin 20 mg, RYR -Current exercise habits: see HTN -Educated on Cholesterol goals;  Benefits of statin for ASCVD risk reduction; -Counseled on diet and exercise extensively  Patient Goals/Self-Care Activities . Over the next 365 days, patient will:  - take medications as prescribed target a minimum of 150 minutes of moderate intensity exercise weekly     Future Appointments  Date Time Provider [place]  01/29/2021  8:20 AM Marin Olp, MD [facility]  02/01/2021  9:00 AM Philemon Kingdom, MD [facility] None  09/26/2021  9:00 AM [facility] CCM PHARMACIST [facility]  10/22/2021 10:15 AM [facility] HEALTH COACH [facility]   Follow-up plan with Care Management Team: . CPA: 6 month BP . RPH: 10 month telephone visit visit  Madelin Rear, Pharm.D., BCGP Clinical Pharmacist [place] [phone]

## 2021-01-10 DIAGNOSIS — Z961 Presence of intraocular lens: Secondary | ICD-10-CM | POA: Diagnosis not present

## 2021-01-10 DIAGNOSIS — Z0100 Encounter for examination of eyes and vision without abnormal findings: Secondary | ICD-10-CM | POA: Diagnosis not present

## 2021-01-11 ENCOUNTER — Encounter: Payer: Self-pay | Admitting: Internal Medicine

## 2021-01-11 ENCOUNTER — Other Ambulatory Visit: Payer: Self-pay

## 2021-01-11 ENCOUNTER — Ambulatory Visit: Payer: Medicare HMO | Admitting: Internal Medicine

## 2021-01-11 VITALS — BP 140/88 | HR 67 | Ht 71.0 in | Wt 171.2 lb

## 2021-01-11 DIAGNOSIS — L814 Other melanin hyperpigmentation: Secondary | ICD-10-CM | POA: Diagnosis not present

## 2021-01-11 DIAGNOSIS — L570 Actinic keratosis: Secondary | ICD-10-CM | POA: Diagnosis not present

## 2021-01-11 DIAGNOSIS — L309 Dermatitis, unspecified: Secondary | ICD-10-CM | POA: Diagnosis not present

## 2021-01-11 DIAGNOSIS — E042 Nontoxic multinodular goiter: Secondary | ICD-10-CM

## 2021-01-11 DIAGNOSIS — L817 Pigmented purpuric dermatosis: Secondary | ICD-10-CM | POA: Diagnosis not present

## 2021-01-11 DIAGNOSIS — L821 Other seborrheic keratosis: Secondary | ICD-10-CM | POA: Diagnosis not present

## 2021-01-11 DIAGNOSIS — D225 Melanocytic nevi of trunk: Secondary | ICD-10-CM | POA: Diagnosis not present

## 2021-01-11 DIAGNOSIS — L578 Other skin changes due to chronic exposure to nonionizing radiation: Secondary | ICD-10-CM | POA: Diagnosis not present

## 2021-01-11 DIAGNOSIS — Z8639 Personal history of other endocrine, nutritional and metabolic disease: Secondary | ICD-10-CM

## 2021-01-11 NOTE — Progress Notes (Addendum)
Patient ID: YU PEGGS, male   DOB: 01/25/1946, 75 y.o.   MRN: [id number]    HPI  Clifford Houston is a 75 y.o.-year-old male, returning for follow-up for multiple thyroid nodules and h/o thyrotoxicosis.  Last visit 1 year and 3 months ago.  Interim hx: Since last visit, he tried a statin last spring >> allergic skin rxn >> still dry skin on hands >> now off the statin He was dx'ed with a squamous cell CA on L face. He has an appt with dermatology today.  Otherwise, he has good energy and does not have any complaints.  He continues to walk up to 3.5 miles daily.  Reviewed history: Patient has a long history of thyroid nodules since at least 2007, and has had an FNA of the right inf thyroid nodule (2007): Benign  Thyroid U/S (11/20/2015): Thyroid nodules have slightly increased in size. The right nodule meets criteria for biopsy.  Right inferior nodule has increased from 1.5 to 2 cm Right mid nodule has increased from 1.5 to 1.7 cm   Thyroid uptake and scan (12/28/2015): Normal uptake, 13.7% and also normal scan, without cold nodules  Dr. Loanne Drilling suggested to have RAI treatment, but patient refused and came to see me in 2018 for a second opinion.  Based on his studies and results, I did not suggest RAI treatment.  At last visit we checked another thyroid U/S (11/27/2018) which showed stability of the nodules: Parenchymal Echotexture: Moderately heterogenous Isthmus: 0.7 cm, previously 0.9 cm Right lobe: 7.4 x 3.3 x 2.9 cm, previously 8.0 x 3.2 x 3.1 cm Left lobe: 6.9 x 2.3 x 2.5 cm, previously 7.3 x 2.7 x 2.5 cm __________________________________________________________  Nodule # 1: Prior biopsy: No Location: Right; Mid Maximum size: 1.9 cm; Other 2 dimensions: 0.8 x 1.2 cm, previously, 1.7 x 0.9 x 1.5 cm Composition: solid/almost completely solid (2) Echogenicity: isoechoic (1) Significant change in size (>/= 20% in two dimensions and minimal increase of 2 mm): No *Given  size (>/= 1.5 - 2.4 cm) and appearance, a follow-up ultrasound in 1 year should be considered based on TI-RADS criteria. _______________________________________________  Partially cystic nodule in the superior left thyroid lobe measuring up to 0.8 cm.  Previously, there was a heterogeneous nodule in the inferior posterior aspect of the right thyroid lobe. There is not a well-defined nodule in this area but rather small complex cystic collections and heterogeneous tissue.   Heterogeneous area in the inferior right thyroid lobe roughly measures up to 1.8 cm and previously measured up to 2.0 cm on the prior examination.  IMPRESSION: 1. Enlarged heterogeneous thyroid tissue with scattered nodules. 2. Isoechoic nodule in the mid right thyroid lobe is stable since 2017. This nodule meets criteria for annual surveillance until there is 5 years of stability. 3. Heterogeneous nodular area in the inferior right thyroid lobe has minimally changed. 4. No new suspicious thyroid nodules.  Pt denies: - feeling nodules in neck - hoarseness - dysphagia - choking - SOB with lying down  Reviewed patient's TFTs: Lab Results  Component Value Date   TSH 0.74 01/28/2020   TSH 0.60 11/23/2018   TSH 0.47 11/20/2017   TSH 0.71 08/13/2017   TSH 0.71 11/19/2016   TSH 0.53 11/15/2015   TSH 0.56 11/07/2014   TSH 0.52 11/03/2012   TSH 0.24 (L) 10/06/2012   TSH 0.40 01/01/2010   FREET4 0.86 01/28/2020   FREET4 0.85 08/13/2017   FREET4 0.91 11/19/2016   FREET4 0.83 11/03/2012  Pt denies: - weight loss - heat intolerance - tremors - palpitations - anxiety - hyperdefecation  + FH of thyroid ds.: father:  goiter; sister: hyperthyroidism. Nephew with parathyroidectomy. ? FH of thyroid cancer in aunt in her 81s. No h/o radiation tx to head or neck.  No seaweed or kelp. No recent contrast studies. No herbal supplements. No Biotin use. No recent steroids use.   Pt also has a history of HTN,  HL, skin CA, neck pain.  ROS: Constitutional: + See HPI Eyes: no blurry vision, no xerophthalmia ENT: no sore throat, + see HPI Cardiovascular: no CP/no SOB/no palpitations/no leg swelling Respiratory: no cough/no SOB/no wheezing Gastrointestinal: no N/no V/no D/no C/no acid reflux Musculoskeletal: no muscle aches/no joint aches Skin: no rashes, no hair loss Neurological: no Tremors/no numbness/no tingling/no dizziness  I reviewed pt's medications, allergies, PMH, social hx, family hx, and changes were documented in the history of present illness.   Past Medical History:  Diagnosis Date  . GERD (gastroesophageal reflux disease)   . Hyperlipidemia   . Hypertension   . THYROID NODULE 05/05/2007   Multiple nodules. Biopsy 2007 hyperplastic. No further follow up planned.     Past Surgical History:  Procedure Laterality Date  . CATARACT EXTRACTION, BILATERAL     dec 6th 2018 right eye, dec 20th 2018 left eye  . THYROID SURGERY     biopsy 2007 benign   Social History   Social History Main Topics  . Smoking status: Former Smoker    Packs/day: 1.00    Years: 6.00    Types: Cigarettes    Quit date: 10/14/1965  . Smokeless tobacco: Never Used  . Alcohol use 0.6 oz/week    1 Standard drinks or equivalent per week  . Drug use: No   Social History Narrative   Married (wife patient of Dr. Regis Bill). 2 sons. 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       Hobbies: golf-runs church golf group, go see grandkids. Walk 3 miles alternating days with going to [facility]   Current Outpatient Medications on File Prior to Visit  Medication Sig Dispense Refill  . amLODipine (NORVASC) 10 MG tablet Take 10 mg by mouth daily.    . chlorhexidine (PERIDEX) 0.12 % solution SMARTSIG:By Mouth    . co-enzyme Q-10 30 MG capsule Take 300 mg by mouth  daily.    . fish oil-omega-3 fatty acids 1000 MG capsule Take 2 g by mouth daily.    . Multiple Vitamin (MULTIVITAMIN) tablet Take 1 tablet by mouth 3 (three) times a week.     No current facility-administered medications on file prior to visit.   Allergies  Allergen Reactions  . Lisinopril     Mild cough  . Rosuvastatin     Itching and rash   Family History  Problem Relation Age of Onset  . Heart disease Father 39       smoker  . Thyroid disease Father        goiter  . Hypertension Sister   . Asthma Sister   . Emphysema Sister   . Heart attack Brother 51       did not eat well, no medical care  . Hypertension Brother   . Heart disease Brother   . Diabetes Mother   . Stroke Mother   . Diabetes Sister   .  Thyroid disease Sister   . Hypertension Sister   . Hypertension Brother     PE: BP 140/88 (BP Location: Right Arm, Patient Position: Sitting, Cuff Size: Normal)   Pulse 67   Ht 5\' 11"  (1.803 m)   Wt 171 lb 3.2 oz (77.7 kg)   SpO2 97%   BMI 23.88 kg/m  Wt Readings from Last 3 Encounters:  01/11/21 171 lb 3.2 oz (77.7 kg)  07/20/20 171 lb (77.6 kg)  05/29/20 166 lb 6.4 oz (75.5 kg)   Constitutional: normal weight, in NAD Eyes: PERRLA, EOMI, no exophthalmos ENT: moist mucous membranes, no thyromegaly or nodules palpable in his lower neck, no cervical lymphadenopathy Cardiovascular: RRR, No MRG Respiratory: CTA B Gastrointestinal: abdomen soft, NT, ND, BS+ Musculoskeletal: no deformities, strength intact in all 4 Skin: moist, warm, no rashes Neurological: No tremor with outstretched hands, DTR normal in all 4  ASSESSMENT: 1. Multiple thyroid nodules  2. History of suppressed TSH  PLAN: 1. Multiple thyroid nodules -Patient has a history of stable thyroid nodules since at least 2017.  However, his nodules were actually detected in 2007 and he had a biopsy of the right inferior nodule, previously measuring 1.9 cm, which returned benign in 2007.  On the last  ultrasound from 2020, this area appears heterogeneous, but without a clear nodule.  His right mid nodule appears isoechoic and only slightly increased in size from 1.7 to 1.9 cm.  This nodule qualified for 1 year follow-up.  There are no other significant nodules throughout his thyroid on the latest ultrasound from 2020. -He is not a particularly high thyroid cancer risk.  He does not have a history of radiation therapy to head or neck and does not have a significant thyroid cancer family history. -At this visit, he describes no neck compression symptoms -we decided to repeat another ultrasound but if the nodules appear stable, no further follow-up is needed -After this, I will see him back as needed  2. H/o Suppressed TSH -He had 1 mildly low TSH in the past, but otherwise normal levels -We reviewed together the latest TFTs: TSH normal in 01/2020 -He has no signs or symptoms of thyrotoxicosis -At today's visit we discussed about repeating the TSH, however, he has an appointment coming up with PCP and would like to wait until then to get this checked.  Orders Placed This Encounter  Procedures  . US THYROID   Thyroid U/S (01/25/2021): Parenchymal Echotexture: Mildly heterogenous Isthmus: 7 mm Right lobe: 7.5 x 3.0 x 3.4 cm Left lobe: 6.7 x 2.8 x 2.9 cm _________________________________________________________  Nodule # 1: Location: Right; Mid Maximum size: 1.6, previously 1.9 cm; Other 2 dimensions: 1.4 x 1.4 cm Composition: solid/almost completely solid (2) Echogenicity: isoechoic (1) ACR TI-RADS total points: 3. ACR TI-RADS risk category: TR3 (3 points). *Given size (>/= 1.5 - 2.4 cm) and appearance, a follow-up ultrasound in 1 year should be considered based on TI-RADS criteria. _________________________________________________________  Nodule # 2: Location: Left; Mid Maximum size: 1.6 cm; Other 2 dimensions: 1.4 x 1.4 cm Composition: solid/almost completely solid  (2) Echogenicity: isoechoic (1) ACR TI-RADS risk category: TR3 (3 points). *Given size (>/= 1.5 - 2.4 cm) and appearance, a follow-up ultrasound in 1 year should be considered based on TI-RADS criteria. _________________________________________________________  No hypervascularity.  No regional adenopathy.  There are additional bilateral subcentimeter cystic nodules noted, all 9 mm or less in size. These would not meet criteria for any biopsy or follow-up.  IMPRESSION: Bilateral TR  3 nodules as detailed above which meet criteria for continued annual follow-up.  I reviewed the ultrasound images and it appears that the right thyroid nodule appears stable or even smaller than before, however, the left thyroid nodule appears more like an area of heterogeneity, then like a nodule. I would not suggest further follow-up for the nodules.  Philemon Kingdom, MD PhD [facility] Endocrinology

## 2021-01-11 NOTE — Patient Instructions (Signed)
  We will order a new thyroid U/S.  Please ask Dr. Yong Channel to check a TSH.  Please come back as needed.

## 2021-01-24 NOTE — Patient Instructions (Addendum)
Please stop by lab before you go If you have mychart- we will send your results within 3 business days of [place] receiving them.  If you do not have mychart- we will call you about results within 5 business days of [place] receiving them.  *please also note that you will see labs on mychart as soon as they post. I will later go in and write notes on them- will say "notes from Dr. Yong Channel"  No changes today unless labs lead [place] to make changes  Recommended follow up: Return in about 1 year (around 01/29/2022) for physical or sooner if needed.

## 2021-01-24 NOTE — Progress Notes (Signed)
Phone: [phone]   Subjective:  Patient presents today for their annual physical. Chief complaint-noted.   See problem oriented charting- ROS- full  review of systems was completed and negative per full ROS sheet  The following were reviewed and entered/updated in epic: Past Medical History:  Diagnosis Date  . GERD (gastroesophageal reflux disease)   . Hyperlipidemia   . Hypertension   . THYROID NODULE 05/05/2007   Multiple nodules. Biopsy 2007 hyperplastic. No further follow up planned.     Patient Active Problem List   Diagnosis Date Noted  . Burning sensation 07/20/2020    Priority: Medium  . Cervical radiculopathy 11/20/2017    Priority: Medium  . Multiple thyroid nodules 08/13/2017    Priority: Medium  . History of thyrotoxicosis 08/13/2017    Priority: Medium  . Hyperglycemia 11/19/2016    Priority: Medium  . Hyperlipidemia 05/05/2007    Priority: Medium  . Essential hypertension 05/05/2007    Priority: Medium  . Former smoker 11/14/2014    Priority: Low  . History of skin cancer 11/07/2014    Priority: Low  . GERD 05/05/2007    Priority: Low   Past Surgical History:  Procedure Laterality Date  . CATARACT EXTRACTION, BILATERAL     dec 6th 2018 right eye, dec 20th 2018 left eye  . THYROID SURGERY     biopsy 2007 benign    Family History  Problem Relation Age of Onset  . Heart disease Father 67       smoker  . Thyroid disease Father        goiter  . Hypertension Sister   . Asthma Sister   . Emphysema Sister   . Heart attack Brother 51       did not eat well, no medical care  . Hypertension Brother   . Heart disease Brother   . Diabetes Mother   . Stroke Mother   . Diabetes Sister   . Thyroid disease Sister   . Hypertension Sister   . Hypertension Brother     Medications- reviewed and updated Current Outpatient Medications  Medication Sig Dispense Refill  . amLODipine (NORVASC) 10 MG tablet Take 10 mg by mouth daily.    . chlorhexidine  (PERIDEX) 0.12 % solution daily as needed.    Marland Kitchen co-enzyme Q-10 30 MG capsule Take 300 mg by mouth daily.    . fish oil-omega-3 fatty acids 1000 MG capsule Take 2 g by mouth daily. Half tablet    . Multiple Vitamin (MULTIVITAMIN) tablet Take 1 tablet by mouth 3 (three) times a week.     No current facility-administered medications for this visit.    Allergies-reviewed and updated Allergies  Allergen Reactions  . Lisinopril     Mild cough  . Rosuvastatin     Itching and rash    Social History   Social History Narrative   Married (wife patient of Dr. Regis Bill). 2 sons. 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       Hobbies: golf-runs church golf group, go see grandkids. Walk 3 miles alternating days with going to [facility]   Objective  Objective:  BP 131/80 Comment: average of 2nd readings at home once has chance to rest  Pulse 64   Temp (!) 97.2 F (36.2 C) (Temporal)   Ht 5\' 11"  (1.803 m)   Wt 171  lb (77.6 kg)   SpO2 98%   BMI 23.85 kg/m  Gen: NAD, resting comfortably HEENT: Mucous membranes are moist. Oropharynx normal Neck: stable thyromegaly CV: RRR no murmurs rubs or gallops Lungs: CTAB no crackles, wheeze, rhonchi Abdomen: soft/nontender/nondistended/normal bowel sounds. No rebound or guarding.  Ext: no edema Skin: warm, dry Neuro: grossly normal, moves all extremities, PERRLA    Assessment and Plan  75 y.o. male presenting for annual physical.  Health Maintenance counseling: 1. Anticipatory guidance: Patient counseled regarding regular dental exams -q6 months, eye exams -yearly,  avoiding smoking and second hand smoke , limiting alcohol to 2 beverages per day - 1 beer a day, no illicit drugs.   2. Risk factor reduction:  Advised patient of need for regular exercise and diet rich and fruits and vegetables to  reduce risk of heart attack and stroke. Exercise- walking 2 miles a day over last year he reports- even more lately. Diet-reasonably healthy.  Wt Readings from Last 3 Encounters:  01/29/21 171 lb (77.6 kg)  01/11/21 171 lb 3.2 oz (77.7 kg)  07/20/20 171 lb (77.6 kg)  3. Immunizations/screenings/ancillary studies- discussed cdc recommendations 4th vaccination covid - he is going to hold off until #s higher locally Immunization History  Administered Date(s) Administered  . Influenza Split 07/14/2012, 06/28/2013  . Influenza Whole 07/14/2019  . Influenza, High Dose Seasonal PF 06/29/2018, 07/17/2020  . Influenza-Unspecified 07/17/2014, 06/15/2015, 07/31/2016, 06/30/2017  . PFIZER(Purple Top)SARS-COV-2 Vaccination 11/11/2019, 12/09/2019, 06/23/2020  . Pneumococcal Conjugate-13 11/07/2014  . Pneumococcal Polysaccharide-23 10/06/2012  . Tdap 01/08/2010, 02/22/2020  . Zoster 01/08/2010  . Zoster Recombinat (Shingrix) 10/26/2018, 03/23/2019   4. Prostate cancer screening- patient wants to screen through 76-continues to have nocturia  Lab Results  Component Value Date   PSA 1.28 01/28/2020   PSA 1.06 11/23/2018   PSA 1.13 11/20/2017   5. Colon cancer screening - 03/04/2018 with Dr. Collene Mares with 5-year repeat scheduled-thinks he had polyps years ago. Has hemorrhoid that pokes out intermittently- suppositories that help. Does not sit on toilet prolonged periods. Has seen Dr. Collene Mares about this as well- has considered having them removed.  6. Skin cancer screening-follow-up with Dr. Renda Rolls. Had squamous cell removed with cream thankfully after confirmed with biopsy. advised regular sunscreen use. Denies worrisome, changing, or new skin lesions. #blood vessel concern right leg - saw Dr. Renda Rolls who recommended compression stockings- he has not tried that yet but has not had recurrence- using just higher socks has helped. Apparently had been itchy- a cream helped 7. FORMER smoker-6 pack years quit 1967-  medicare screen 2016 normal for AAA 8. STD screening - only active with wife  Status of chronic or acute concerns   #lost father in law at 26- he had been helping care for him  #hypertension S: compliant with amlodipine 10 mg - on 2nd reading at home averaging 131/80.  BP Readings from Last 3 Encounters:  01/29/21 131/80  01/11/21 140/88  07/20/20 124/70  A/P: well controlled on home readings listed above on rechecks- continue current medicine   #hyperlipidemia-coronary calcium score 48.6 on February 08, 2020-typically statins most beneficial with LDL above 100.  S: no current rx. Cut down on his fish oil. Has red yeast rice but not taking - rash on statin in the past- much better off- gets slight redness around neck.  Lab Results  Component Value Date   CHOL 192 07/17/2020   HDL 52 07/17/2020   LDLCALC 121 (H) 07/17/2020   LDLDIRECT 109.0 06/29/2018  TRIG 91 07/17/2020   CHOLHDL 3.7 07/17/2020  A/P: hopefully stable- update lipids- only on lower dose fish oil now     # Hyperglycemia/insulin resistance/prediabetes S: Peak A1c of 5.7 in 2019 Exercise and diet- see above Lab Results  Component Value Date   HGBA1C 5.6 01/28/2020   HGBA1C 5.6 11/23/2018   HGBA1C 5.7 06/29/2018  A/P: hopefully stable- has looked good last 2 checks   % Thyroid nodule/History of thyrotoxicosis -patient follows with Dr. Norva Riffle with thyroid nodules since at least 2007.  He has had fine-needle aspiration in 2017.  Thyroid uptake and scan in 2017 with normal uptake.  There was some consideration for radioactive iodine treatment but ultimately patient and physician opted against this.  He has done well since that time.  - updated thyroid ultrasound 12/25/20 with nodules with no significant change- no further follow up needed per note 01/25/21-  -now released from endocrinology  Recommended follow up: Return in about 1 year (around 01/29/2022) for physical or sooner if needed. Future Appointments   Date Time Provider [place]  09/26/2021  9:00 AM [facility] CCM PHARMACIST [facility]  10/22/2021 10:15 AM [facility] HEALTH COACH [facility]   Lab/Order associations: fasting   ICD-10-CM   1. Preventative health care  Z00.00 CBC with Differential/Platelet    Comprehensive metabolic panel    Lipid panel    TSH    Hemoglobin A1c    PSA  2. Essential hypertension  I10   3. Gastroesophageal reflux disease without esophagitis  K21.9   4. Hyperlipidemia, unspecified hyperlipidemia type  E78.5 CBC with Differential/Platelet    Comprehensive metabolic panel    Lipid panel  5. Hyperglycemia  R73.9 Hemoglobin A1c  6. Former smoker  Z87.891   74. Nocturia  R35.1 PSA  8. Multiple thyroid nodules  E04.2 TSH    No orders of the defined types were placed in this encounter.   Return precautions advised.  Garret Reddish, MD

## 2021-01-25 ENCOUNTER — Ambulatory Visit
Admission: RE | Admit: 2021-01-25 | Discharge: 2021-01-25 | Disposition: A | Discharge status: Home / Self Care | Payer: Medicare HMO | Source: Ambulatory Visit | Attending: Internal Medicine | Admitting: Internal Medicine

## 2021-01-25 DIAGNOSIS — E042 Nontoxic multinodular goiter: Secondary | ICD-10-CM

## 2021-01-25 DIAGNOSIS — E041 Nontoxic single thyroid nodule: Secondary | ICD-10-CM | POA: Diagnosis not present

## 2021-01-29 ENCOUNTER — Ambulatory Visit (INDEPENDENT_AMBULATORY_CARE_PROVIDER_SITE_OTHER): Payer: Medicare HMO | Admitting: Family Medicine

## 2021-01-29 ENCOUNTER — Encounter: Payer: Self-pay | Admitting: Family Medicine

## 2021-01-29 ENCOUNTER — Other Ambulatory Visit: Payer: Self-pay

## 2021-01-29 VITALS — BP 131/80 | HR 64 | Temp 97.2°F | Ht 71.0 in | Wt 171.0 lb

## 2021-01-29 DIAGNOSIS — R351 Nocturia: Secondary | ICD-10-CM

## 2021-01-29 DIAGNOSIS — K219 Gastro-esophageal reflux disease without esophagitis: Secondary | ICD-10-CM | POA: Diagnosis not present

## 2021-01-29 DIAGNOSIS — I10 Essential (primary) hypertension: Secondary | ICD-10-CM | POA: Diagnosis not present

## 2021-01-29 DIAGNOSIS — Z0000 Encounter for general adult medical examination without abnormal findings: Secondary | ICD-10-CM | POA: Diagnosis not present

## 2021-01-29 DIAGNOSIS — E785 Hyperlipidemia, unspecified: Secondary | ICD-10-CM | POA: Diagnosis not present

## 2021-01-29 DIAGNOSIS — E042 Nontoxic multinodular goiter: Secondary | ICD-10-CM

## 2021-01-29 DIAGNOSIS — R739 Hyperglycemia, unspecified: Secondary | ICD-10-CM

## 2021-01-29 DIAGNOSIS — Z87891 Personal history of nicotine dependence: Secondary | ICD-10-CM

## 2021-01-29 LAB — CBC WITH DIFFERENTIAL/PLATELET
Basophils Absolute: 0 10*3/uL (ref 0.0–0.1)
Basophils Relative: 0.8 % (ref 0.0–3.0)
Eosinophils Absolute: 0.4 10*3/uL (ref 0.0–0.7)
Eosinophils Relative: 9.3 % — ABNORMAL HIGH (ref 0.0–5.0)
HCT: 41.3 % (ref 39.0–52.0)
Hemoglobin: 14.1 g/dL (ref 13.0–17.0)
Lymphocytes Relative: 27.8 % (ref 12.0–46.0)
Lymphs Abs: 1.3 10*3/uL (ref 0.7–4.0)
MCHC: 34.3 g/dL (ref 30.0–36.0)
MCV: 92.8 fl (ref 78.0–100.0)
Monocytes Absolute: 0.4 10*3/uL (ref 0.1–1.0)
Monocytes Relative: 7.7 % (ref 3.0–12.0)
Neutro Abs: 2.6 10*3/uL (ref 1.4–7.7)
Neutrophils Relative %: 54.4 % (ref 43.0–77.0)
Platelets: 220 10*3/uL (ref 150.0–400.0)
RBC: 4.44 Mil/uL (ref 4.22–5.81)
RDW: 13 % (ref 11.5–15.5)
WBC: 4.8 10*3/uL (ref 4.0–10.5)

## 2021-01-29 LAB — COMPREHENSIVE METABOLIC PANEL
ALT: 16 U/L (ref 0–53)
AST: 25 U/L (ref 0–37)
Albumin: 4.4 g/dL (ref 3.5–5.2)
Alkaline Phosphatase: 48 U/L (ref 39–117)
BUN: 15 mg/dL (ref 6–23)
CO2: 29 mEq/L (ref 19–32)
Calcium: 9.8 mg/dL (ref 8.4–10.5)
Chloride: 103 mEq/L (ref 96–112)
Creatinine, Ser: 1.03 mg/dL (ref 0.40–1.50)
GFR: 71.99 mL/min (ref >60.00)
Glucose, Bld: 96 mg/dL (ref 70–99)
Potassium: 4.7 mEq/L (ref 3.5–5.1)
Sodium: 139 mEq/L (ref 135–145)
Total Bilirubin: 0.7 mg/dL (ref 0.2–1.2)
Total Protein: 7.3 g/dL (ref 6.0–8.3)

## 2021-01-29 LAB — LIPID PANEL
Cholesterol: 193 mg/dL (ref 0–200)
HDL: 47.4 mg/dL (ref >39.00)
LDL Cholesterol: 130 mg/dL — ABNORMAL HIGH (ref 0–99)
NonHDL: 145.66
Total CHOL/HDL Ratio: 4
Triglycerides: 78 mg/dL (ref 0.0–149.0)
VLDL: 15.6 mg/dL (ref 0.0–40.0)

## 2021-01-29 LAB — HEMOGLOBIN A1C: Hgb A1c MFr Bld: 5.7 % (ref 4.6–6.5)

## 2021-01-29 LAB — TSH: TSH: 0.6 u[IU]/mL (ref 0.35–4.50)

## 2021-01-29 LAB — PSA: PSA: 1.32 ng/mL (ref 0.10–4.00)

## 2021-02-01 ENCOUNTER — Ambulatory Visit: Payer: Medicare HMO | Admitting: Internal Medicine

## 2021-03-15 ENCOUNTER — Telehealth (INDEPENDENT_AMBULATORY_CARE_PROVIDER_SITE_OTHER): Payer: Medicare HMO | Admitting: Family Medicine

## 2021-03-15 ENCOUNTER — Encounter: Payer: Self-pay | Admitting: Family Medicine

## 2021-03-15 DIAGNOSIS — R519 Headache, unspecified: Secondary | ICD-10-CM | POA: Diagnosis not present

## 2021-03-15 DIAGNOSIS — H9202 Otalgia, left ear: Secondary | ICD-10-CM | POA: Diagnosis not present

## 2021-03-15 DIAGNOSIS — R0981 Nasal congestion: Secondary | ICD-10-CM

## 2021-03-15 MED ORDER — DOXYCYCLINE HYCLATE 100 MG PO TABS: 100.0000 mg | ORAL_TABLET | Freq: Two times a day (BID) | ORAL | 0 refills | Status: AC

## 2021-03-15 NOTE — Progress Notes (Signed)
Virtual Visit via Telephone Note  I connected with Clifford Houston on 03/15/21 at  1:00 PM EDT by telephone and verified that I am speaking with the correct person using two identifiers.   I discussed the limitations, risks, security and privacy concerns of performing an evaluation and management service by telephone and the availability of in person appointments. I also discussed with the patient that there may be a patient responsible charge related to this service. The patient expressed understanding and agreed to proceed.  Location patient: home, [place] Location provider: work or home office Participants present for the call: patient, provider Patient did not have a visit with me in the prior 7 days to address this/these issue(s).   History of Present Illness:  Acute telemedicine visit for Sinus Issues: -reports chronic intermittent issues for years, usually controlled with a nettie pot -however, lately has had more issues with his sinuses this year and since over 1 week ago symptoms have worsened -Symptoms include: worsening sinus congestion, sinus discomfort, some discomfort around the L ear as well after flying - this is improving now -Denies: fevers, CP, SOB, worst headache of you life, drainage from ear, hearing loss, inability to eat/drink/get out of bed -he wants to try an antibiotic -Pertinent past medical history: of sinus infections -Pertinent medication allergies: Allergies  Allergen Reactions  . Lisinopril     Mild cough  . Rosuvastatin     Itching and rash  -COVID-19 vaccine status: has had 3 doses    Observations/Objective: Patient sounds cheerful and well on the phone. I do not appreciate any SOB. Speech and thought processing are grossly intact. Patient reported vitals:  Assessment and Plan:  Sinus congestion  Discomfort of left ear  Facial discomfort  -we discussed possible serious and likely etiologies, options for evaluation and workup, limitations of  telemedicine visit vs in person visit, treatment, treatment risks and precautions. Pt prefers to treat via telemedicine empirically rather than in person at this moment.  Query sinusitis, possible viral upper respiratory illness with eustachian tube dysfunction, possible COVID-19 versus other.  He wants to try an antibiotic for possible sinusitis, as has dealt with issues with his sinuses for years, and has had sinus infections in the past.  Reports has not taken any antibiotics recently.  Sent doxycycline 100 mg twice daily for 10 days after discussion of options/risk.  He also agrees to do COVID testing at home. Scheduled follow up with PCP offered: he agrees to seek inperson care with PCP or elsewhere if not improved on the abx. Advised to seek prompt in person care if worsening, new symptoms arise, or if is not improving with treatment. Advised of options for inperson care in case PCP office not available. Did let the patient know that I only do telemedicine shifts for [place] on Tuesdays and Thursdays and advised a follow up visit with PCP or at an [facility] if has further questions or concerns.   Follow Up Instructions:  I did not refer this patient for an OV with me in the next 24 hours for this/these issue(s).  I discussed the assessment and treatment plan with the patient. The patient was provided an opportunity to ask questions and all were answered. The patient agreed with the plan and demonstrated an understanding of the instructions.   I spent 16 minutes on the date of this visit in the care of this patient. See summary of tasks completed to properly care for this patient in the detailed notes above  which also included counseling of above, review of PMH, medications, allergies, evaluation of the patient and ordering and/or  instructing patient on testing and care options.     Lucretia Kern, DO

## 2021-03-15 NOTE — Patient Instructions (Signed)
-  I sent the medication(s) we discussed to your pharmacy: Meds ordered this encounter  Medications  . doxycycline (VIBRA-TABS) 100 MG tablet    Sig: Take 1 tablet (100 mg total) by mouth 2 (two) times daily.    Dispense:  20 tablet    Refill:  0     I hope you are feeling better soon!  Seek in person care promptly if your symptoms worsen, new concerns arise or you are not improving with treatment.  It was nice to meet you today. I help [place] out with telemedicine visits on Tuesdays and Thursdays and am available for visits on those days. If you have any concerns or questions following this visit please schedule a follow up visit with your Primary Care doctor or seek care at a local urgent care clinic to avoid delays in care.  

## 2021-03-16 ENCOUNTER — Encounter: Payer: Self-pay | Admitting: Family Medicine

## 2021-03-16 MED ORDER — AMLODIPINE BESYLATE 10 MG PO TABS: 10.0000 mg | ORAL_TABLET | Freq: Every day | ORAL | 1 refills | Status: AC

## 2021-03-23 DIAGNOSIS — K648 Other hemorrhoids: Secondary | ICD-10-CM | POA: Diagnosis not present

## 2021-04-17 ENCOUNTER — Telehealth: Payer: Self-pay

## 2021-04-17 NOTE — Chronic Care Management (AMB) (Signed)
    Chronic Care Management Pharmacy Assistant   Name: TANIA PERROTT  MRN: [id number] DOB: 11/04/1945  Reason for Encounter: General Adherence Call  Recent office visits:  03/15/21- Colin Benton, DO (Video Visit [place])- patient seen for sinus congestion, short course doxycycline hyclate 100 mg x 10 days, no follow up documented  01/29/21- Garret Reddish, MD (PCP)- seen for annual physical, no medication changes, follow up 1 yr   Recent consult visits:  01/11/21- Pecola Lawless, MD (Endocrinology)- seen for follow up of multiple thyroid nodules and h/o of thyrotoxicosis, no medication changes, no follow up documented   Hospital visits:  None in previous 6 months  Medications: Outpatient Encounter Medications as of 04/17/2021  Medication Sig Note   amLODipine (NORVASC) 10 MG tablet Take 1 tablet (10 mg total) by mouth daily.    chlorhexidine (PERIDEX) 0.12 % solution daily as needed.    co-enzyme Q-10 30 MG capsule Take 300 mg by mouth daily.    doxycycline (VIBRA-TABS) 100 MG tablet Take 1 tablet (100 mg total) by mouth 2 (two) times daily.    fish oil-omega-3 fatty acids 1000 MG capsule Take 2 g by mouth daily. Half tablet    Multiple Vitamin (MULTIVITAMIN) tablet Take 1 tablet by mouth daily. 10/16/2020: Daily    No facility-administered encounter medications on file as of 04/17/2021.   I spoke with Mr. Gaspare today and he is doing wonderfully. He was packing for his move to [place] when I called. He recently decided to move with his wife to [place] after thinking about the need to be closer with his family. One of his sons and his family live in [place] and they have two kids. One which is 7 and the other 10, both which Mr. Deidrick feels will benefit from their grandparents being there. Mr. Rebel father in law passed earlier this year so this move has been blissful in the respect of completing all affairs. Mr. Robley and his wife are very excited about moving since being  in [place] has proved to be beneficial to them both. Ms. Verdis Frederickson has been able to remain more active since there is a better air quality out there. They will also be moving into an apartment with salt water pools and activity centers which they can both enjoy. They are working hard to get all of their packing done as they will be leaving next week and have most things in order. He and Ms. Verdis Frederickson recently filled their medications so in the meantime they will be looking to establish care in [place]. Mr. Naheem is aware that he can call his providers to request refills if he is unable to establish care before needing any fills. He wishes everyone the best and is beyond ready to begin this new stage in both he and his wife's life.  Star Rating Drugs: No star rating drugs  Cottie Banda, CMA

## 2021-05-10 ENCOUNTER — Telehealth: Payer: Self-pay | Admitting: Pharmacist

## 2021-05-10 NOTE — Chronic Care Management (AMB) (Signed)
    Chronic Care Management Pharmacy Assistant   Name: Clifford Houston  MRN: [id number] DOB: 11/02/1945   Reason for Encounter: Chart Review    Recent office visits:  None  Recent consult visits:  None  Hospital visits:  None in previous 6 months  Medications: Outpatient Encounter Medications as of 05/10/2021  Medication Sig Note   amLODipine (NORVASC) 10 MG tablet Take 1 tablet (10 mg total) by mouth daily.    chlorhexidine (PERIDEX) 0.12 % solution daily as needed.    co-enzyme Q-10 30 MG capsule Take 300 mg by mouth daily.    doxycycline (VIBRA-TABS) 100 MG tablet Take 1 tablet (100 mg total) by mouth 2 (two) times daily.    fish oil-omega-3 fatty acids 1000 MG capsule Take 2 g by mouth daily. Half tablet    Multiple Vitamin (MULTIVITAMIN) tablet Take 1 tablet by mouth daily. 10/16/2020: Daily    No facility-administered encounter medications on file as of 05/10/2021.    Reviewed chart for medication changes ahead of medication coordination call.  No OVs, Consults, or hospital visits since last care coordination call/Pharmacist visit. (If appropriate, list visit date, provider name)  No medication changes indicated OR if recent visit, treatment plan here.  Patient has a follow up appointment scheduled with clinical pharmacist.  No gaps in adherence identified. Future Appointments  Date Time Provider [place]  09/26/2021  9:00 AM [facility] CCM PHARMACIST [facility]  10/22/2021 10:15 AM [facility] HEALTH COACH [facility]  01/30/2022  8:00 AM Marin Olp, MD [facility]    April D Calhoun, [place] Pharmacist Assistant [phone]

## 2021-06-05 ENCOUNTER — Telehealth: Payer: Self-pay | Admitting: Pharmacist

## 2021-06-05 NOTE — Chronic Care Management (AMB) (Addendum)
    Chronic Care Management Pharmacy Assistant   Name: Clifford Houston  MRN: [id number] DOB: 05/31/1946   Reason for Encounter: General Adherence Call    Recent office visits:  None  Recent consult visits:  None  Hospital visits:  None in previous 6 months  Medications: Outpatient Encounter Medications as of 06/05/2021  Medication Sig Note   amLODipine (NORVASC) 10 MG tablet Take 1 tablet (10 mg total) by mouth daily.    chlorhexidine (PERIDEX) 0.12 % solution daily as needed.    co-enzyme Q-10 30 MG capsule Take 300 mg by mouth daily.    doxycycline (VIBRA-TABS) 100 MG tablet Take 1 tablet (100 mg total) by mouth 2 (two) times daily.    fish oil-omega-3 fatty acids 1000 MG capsule Take 2 g by mouth daily. Half tablet    Multiple Vitamin (MULTIVITAMIN) tablet Take 1 tablet by mouth daily. 10/16/2020: Daily    No facility-administered encounter medications on file as of 06/05/2021.   Patient Questions: Have you had any problems recently with your health? Patient states he has not had any problems recently with his health.  Have you had any problems with your pharmacy? Patient states he has not had any problems recently with his pharmacy.  What issues or side effects are you having with your medications? Patient states he is not currently having any issues or side effects with any of his medications.  What would you like me to pass along to Leata Mouse, CPP for him to help you with?    Patient states he recently moved to [place] to be closer to his grand kids. He has not yet found a new PCP.  What can we do to take care of you better? Patient did not have any suggestions.  Future Appointments  Date Time Provider [place]  09/26/2021  9:00 AM [facility] CCM PHARMACIST [facility]  10/22/2021 10:15 AM [facility] HEALTH COACH [facility]  01/30/2022  8:00 AM Marin Olp, MD [facility]    Star Rating Drugs: None  April D Calhoun, [place] Pharmacist  Assistant [phone]   10 minutes spent in review, coordination, and documentation.  Reviewed by: Beverly Milch, PharmD Clinical Pharmacist [phone]

## 2021-06-29 ENCOUNTER — Encounter: Payer: Self-pay | Admitting: Family Medicine

## 2021-06-29 NOTE — Telephone Encounter (Signed)
See below regarding records.

## 2021-09-26 ENCOUNTER — Telehealth: Payer: Medicare HMO

## 2021-10-22 ENCOUNTER — Ambulatory Visit: Payer: Medicare HMO

## 2022-01-30 ENCOUNTER — Encounter: Payer: Medicare HMO | Admitting: Family Medicine
